# Patient Record
Sex: Female | Born: 1975 | Race: Black or African American | Hispanic: No | Marital: Married | Smoking: Never smoker
Health system: Southern US, Community
[De-identification: ages and names within clinical notes are randomized; demographics above are authoritative.]

## PROBLEM LIST (undated history)

## (undated) ENCOUNTER — Inpatient Hospital Stay: Payer: Self-pay

## (undated) DIAGNOSIS — K8020 Calculus of gallbladder without cholecystitis without obstruction: Secondary | ICD-10-CM

## (undated) DIAGNOSIS — D649 Anemia, unspecified: Secondary | ICD-10-CM

## (undated) DIAGNOSIS — O24419 Gestational diabetes mellitus in pregnancy, unspecified control: Secondary | ICD-10-CM

## (undated) DIAGNOSIS — O139 Gestational [pregnancy-induced] hypertension without significant proteinuria, unspecified trimester: Secondary | ICD-10-CM

## (undated) HISTORY — PX: TUBAL LIGATION: SHX77

## (undated) HISTORY — DX: Gestational diabetes mellitus in pregnancy, unspecified control: O24.419

---

## 2000-01-19 DIAGNOSIS — O139 Gestational [pregnancy-induced] hypertension without significant proteinuria, unspecified trimester: Secondary | ICD-10-CM

## 2000-01-19 HISTORY — DX: Gestational (pregnancy-induced) hypertension without significant proteinuria, unspecified trimester: O13.9

## 2014-08-16 LAB — OB RESULTS CONSOLE GC/CHLAMYDIA
Chlamydia: NEGATIVE
GC PROBE AMP, GENITAL: NEGATIVE

## 2014-08-16 LAB — OB RESULTS CONSOLE RPR: RPR: NONREACTIVE

## 2014-08-16 LAB — OB RESULTS CONSOLE HEPATITIS B SURFACE ANTIGEN: Hepatitis B Surface Ag: NEGATIVE

## 2014-08-16 LAB — OB RESULTS CONSOLE HIV ANTIBODY (ROUTINE TESTING): HIV: NONREACTIVE

## 2014-12-16 ENCOUNTER — Other Ambulatory Visit: Payer: Self-pay | Admitting: Obstetrics and Gynecology

## 2014-12-16 DIAGNOSIS — Z369 Encounter for antenatal screening, unspecified: Secondary | ICD-10-CM

## 2014-12-22 ENCOUNTER — Encounter: Payer: Self-pay | Admitting: Emergency Medicine

## 2014-12-22 ENCOUNTER — Emergency Department
Admission: EM | Admit: 2014-12-22 | Discharge: 2014-12-22 | Disposition: A | Discharge status: Home / Self Care | Payer: Managed Care, Other (non HMO) | Attending: Emergency Medicine | Admitting: Emergency Medicine

## 2014-12-22 DIAGNOSIS — O10011 Pre-existing essential hypertension complicating pregnancy, first trimester: Secondary | ICD-10-CM | POA: Diagnosis not present

## 2014-12-22 DIAGNOSIS — Z3A10 10 weeks gestation of pregnancy: Secondary | ICD-10-CM | POA: Insufficient documentation

## 2014-12-22 DIAGNOSIS — O161 Unspecified maternal hypertension, first trimester: Secondary | ICD-10-CM

## 2014-12-22 DIAGNOSIS — O210 Mild hyperemesis gravidarum: Secondary | ICD-10-CM | POA: Diagnosis present

## 2014-12-22 LAB — CBC
HCT: 39.5 % (ref 35.0–47.0)
Hemoglobin: 13.1 g/dL (ref 12.0–16.0)
MCH: 29.5 pg (ref 26.0–34.0)
MCHC: 33.1 g/dL (ref 32.0–36.0)
MCV: 89 fL (ref 80.0–100.0)
PLATELETS: 379 10*3/uL (ref 150–440)
RBC: 4.44 MIL/uL (ref 3.80–5.20)
RDW: 13.9 % (ref 11.5–14.5)
WBC: 11.9 10*3/uL — ABNORMAL HIGH (ref 3.6–11.0)

## 2014-12-22 LAB — LIPASE, BLOOD: Lipase: 52 U/L — ABNORMAL HIGH (ref 11–51)

## 2014-12-22 LAB — URINALYSIS COMPLETE WITH MICROSCOPIC (ARMC ONLY)
Bilirubin Urine: NEGATIVE
GLUCOSE, UA: NEGATIVE mg/dL
Hgb urine dipstick: NEGATIVE
Leukocytes, UA: NEGATIVE
Nitrite: NEGATIVE
PROTEIN: 100 mg/dL — AB
Specific Gravity, Urine: 1.026 (ref 1.005–1.030)
pH: 6 (ref 5.0–8.0)

## 2014-12-22 LAB — COMPREHENSIVE METABOLIC PANEL
ALT: 19 U/L (ref 14–54)
AST: 22 U/L (ref 15–41)
Albumin: 3.9 g/dL (ref 3.5–5.0)
Alkaline Phosphatase: 55 U/L (ref 38–126)
Anion gap: 11 (ref 5–15)
BILIRUBIN TOTAL: 0.8 mg/dL (ref 0.3–1.2)
BUN: 7 mg/dL (ref 6–20)
CHLORIDE: 101 mmol/L (ref 101–111)
CO2: 26 mmol/L (ref 22–32)
CREATININE: 0.81 mg/dL (ref 0.44–1.00)
Calcium: 10.1 mg/dL (ref 8.9–10.3)
GFR calc Af Amer: >60 mL/min (ref >60)
Glucose, Bld: 111 mg/dL — ABNORMAL HIGH (ref 65–99)
Potassium: 3.3 mmol/L — ABNORMAL LOW (ref 3.5–5.1)
Sodium: 138 mmol/L (ref 135–145)
Total Protein: 8.8 g/dL — ABNORMAL HIGH (ref 6.5–8.1)

## 2014-12-22 MED ORDER — ONDANSETRON HCL 4 MG/2ML IJ SOLN
INTRAMUSCULAR | Status: DC
Start: 2014-12-22 — End: 2014-12-23
  Filled 2014-12-22: qty 2

## 2014-12-22 MED ORDER — SODIUM CHLORIDE 0.9 % IV BOLUS (SEPSIS)
1000.0000 mL | Freq: Once | INTRAVENOUS | Status: AC
Start: 2014-12-22 — End: 2014-12-22
  Administered 2014-12-22: 1000 mL via INTRAVENOUS

## 2014-12-22 MED ORDER — ONDANSETRON HCL 4 MG/2ML IJ SOLN
4.0000 mg | Freq: Once | INTRAMUSCULAR | Status: AC
  Administered 2014-12-22: 4 mg via INTRAVENOUS

## 2014-12-22 NOTE — ED Notes (Signed)
States vomiting x 4 days.  Patient is [redacted] weeks pregnant.  Given a  RX for Phenergan 25 mg 1 tablet every 6 hours, last taken at 1000.  Patient states it is not helping. Denies urinary complaint.

## 2014-12-22 NOTE — Discharge Instructions (Signed)
Hypertension During Pregnancy Hypertension, or high blood pressure, is when there is extra pressure inside your blood vessels that carry blood from the heart to the rest of your body (arteries). It can happen at any time in life, including pregnancy. Hypertension during pregnancy can cause problems for you and your baby. Your baby might not weigh as much as he or she should at birth or might be born early (premature). Very bad cases of hypertension during pregnancy can be life-threatening.  Different types of hypertension can occur during pregnancy. These include:  Chronic hypertension. This happens when a woman has hypertension before pregnancy and it continues during pregnancy.  Gestational hypertension. This is when hypertension develops during pregnancy.  Preeclampsia or toxemia of pregnancy. This is a very serious type of hypertension that develops only during pregnancy. It affects the whole body and can be very dangerous for both mother and baby.  Gestational hypertension and preeclampsia usually go away after your baby is born. Your blood pressure will likely stabilize within 6 weeks. Women who have hypertension during pregnancy have a greater chance of developing hypertension later in life or with future pregnancies. RISK FACTORS There are certain factors that make it more likely for you to develop hypertension during pregnancy. These include:  Having hypertension before pregnancy.  Having hypertension during a previous pregnancy.  Being overweight.  Being older than 40 years.  Being pregnant with more than one baby.  Having diabetes or kidney problems. SIGNS AND SYMPTOMS Chronic and gestational hypertension rarely cause symptoms. Preeclampsia has symptoms, which may include:  Increased protein in your urine. Your health care provider will check for this at every prenatal visit.  Swelling of your hands and face.  Rapid weight gain.  Headaches.  Visual changes.  Being  bothered by light.  Abdominal pain, especially in the upper right area.  Chest pain.  Shortness of breath.  Increased reflexes.  Seizures. These occur with a more severe form of preeclampsia, called eclampsia. DIAGNOSIS  You may be diagnosed with hypertension during a regular prenatal exam. At each prenatal visit, you may have:  Your blood pressure checked.  A urine test to check for protein in your urine. The type of hypertension you are diagnosed with depends on when you developed it. It also depends on your specific blood pressure reading.  Developing hypertension before 20 weeks of pregnancy is consistent with chronic hypertension.  Developing hypertension after 20 weeks of pregnancy is consistent with gestational hypertension.  Hypertension with increased urinary protein is diagnosed as preeclampsia.  Blood pressure measurements that stay above 834 systolic or 196 diastolic are a sign of severe preeclampsia. TREATMENT Treatment for hypertension during pregnancy varies. Treatment depends on the type of hypertension and how serious it is.  If you take medicine for chronic hypertension, you may need to switch medicines.  Medicines called ACE inhibitors should not be taken during pregnancy.  Low-dose aspirin may be suggested for women who have risk factors for preeclampsia.  If you have gestational hypertension, you may need to take a blood pressure medicine that is safe during pregnancy. Your health care provider will recommend the correct medicine.  If you have severe preeclampsia, you may need to be in the hospital. Health care providers will watch you and your baby very closely. You also may need to take medicine called magnesium sulfate to prevent seizures and lower blood pressure.  Sometimes, an early delivery is needed. This may be the case if the condition worsens. It would be  done to protect you and your baby. The only cure for preeclampsia is delivery.  Your health  care provider may recommend that you take one low-dose aspirin (81 mg) each day to help prevent high blood pressure during your pregnancy if you are at risk for preeclampsia. You may be at risk for preeclampsia if:  You had preeclampsia or eclampsia during a previous pregnancy.  Your baby did not grow as expected during a previous pregnancy.  You experienced preterm birth with a previous pregnancy.  You experienced a separation of the placenta from the uterus (placental abruption) during a previous pregnancy.  You experienced the loss of your baby during a previous pregnancy.  You are pregnant with more than one baby.  You have other medical conditions, such as diabetes or an autoimmune disease. HOME CARE INSTRUCTIONS  Schedule and keep all of your regular prenatal care appointments. This is important.  Take medicines only as directed by your health care provider. Tell your health care provider about all medicines you take.  Eat as little salt as possible.  Get regular exercise.  Do not drink alcohol.  Do not use tobacco products.  Do not drink products with caffeine.  Lie on your left side when resting. SEEK IMMEDIATE MEDICAL CARE IF:  You have severe abdominal pain.  You have sudden swelling in your hands, ankles, or face.  You gain 4 pounds (1.8 kg) or more in 1 week.  You vomit repeatedly.  You have vaginal bleeding.  You do not feel your baby moving as much.  You have a headache.  You have blurred or double vision.  You have muscle twitching or spasms.  You have shortness of breath.  You have blue fingernails or lips.  You have blood in your urine. MAKE SURE YOU:  Understand these instructions.  Will watch your condition.  Will get help right away if you are not doing well or get worse.   This information is not intended to replace advice given to you by your health care provider. Make sure you discuss any questions you have with your health care  provider.   Document Released: 09/22/2010 Document Revised: 01/25/2014 Document Reviewed: 08/03/2012 Elsevier Interactive Patient Education 2016 [employer]  Hyperemesis Gravidarum Hyperemesis gravidarum is a severe form of nausea and vomiting that happens during pregnancy. Hyperemesis is worse than morning sickness. It may cause you to have nausea or vomiting all day for many days. It may keep you from eating and drinking enough food and liquids. Hyperemesis usually occurs during the first half (the first 20 weeks) of pregnancy. It often goes away once a woman is in her second half of pregnancy. However, sometimes hyperemesis continues through an entire pregnancy.  CAUSES  The cause of this condition is not completely known but is thought to be related to changes in the body's hormones when pregnant. It could be from the high level of the pregnancy hormone or an increase in estrogen in the body.  SIGNS AND SYMPTOMS   Severe nausea and vomiting.  Nausea that does not go away.  Vomiting that does not allow you to keep any food down.  Weight loss and body fluid loss (dehydration).  Having no desire to eat or not liking food you have previously enjoyed. DIAGNOSIS  Your health care provider will do a physical exam and ask you about your symptoms. He or she may also order blood tests and urine tests to make sure something else is not causing the problem.  TREATMENT  You may only need medicine to control the problem. If medicines do not control the nausea and vomiting, you will be treated in the hospital to prevent dehydration, increased acid in the blood (acidosis), weight loss, and changes in the electrolytes in your body that may harm the unborn baby (fetus). You may need IV fluids.  HOME CARE INSTRUCTIONS   Only take over-the-counter or prescription medicines as directed by your health care provider.  Try eating a couple of dry crackers or toast in the morning before getting out of  bed.  Avoid foods and smells that upset your stomach.  Avoid fatty and spicy foods.  Eat 5-6 small meals a day.  Do not drink when eating meals. Drink between meals.  For snacks, eat high-protein foods, such as cheese.  Eat or suck on things that have ginger in them. Ginger helps nausea.  Avoid food preparation. The smell of food can spoil your appetite.  Avoid iron pills and iron in your multivitamins until after 3-4 months of being pregnant. However, consult with your health care provider before stopping any prescribed iron pills. SEEK MEDICAL CARE IF:   Your abdominal pain increases.  You have a severe headache.  You have vision problems.  You are losing weight. SEEK IMMEDIATE MEDICAL CARE IF:   You are unable to keep fluids down.  You vomit blood.  You have constant nausea and vomiting.  You have excessive weakness.  You have extreme thirst.  You have dizziness or fainting.  You have a fever or persistent symptoms for more than 2-3 days.  You have a fever and your symptoms suddenly get worse. MAKE SURE YOU:   Understand these instructions.  Will watch your condition.  Will get help right away if you are not doing well or get worse.   This information is not intended to replace advice given to you by your health care provider. Make sure you discuss any questions you have with your health care provider.   Document Released: 01/04/2005 Document Revised: 10/25/2012 Document Reviewed: 08/16/2012 Elsevier Interactive Patient Education [employer].

## 2014-12-22 NOTE — ED Notes (Signed)
Patient with no complaints at this time. Respirations even and unlabored. Skin warm/dry. Discharge instructions reviewed with patient at this time. Patient given opportunity to voice concerns/ask questions. IV removed per policy and band-aid applied to site. Patient discharged at this time and left Emergency Department with steady gait.  

## 2014-12-22 NOTE — ED Provider Notes (Signed)
[facility] Emergency Department Provider Note  ____________________________________________  Time seen: Approximately 8 PM  I have reviewed the triage vital signs and the nursing notes.   HISTORY  Chief Complaint Emesis During Pregnancy    HPI Taylor Shannon is a 39 y.o. female with a history of C-section and preeclampsia is presenting today with vomiting. She says she has been vomiting over the past 4 days. Despite taking B6 as well as Phenergan at home she has still been vomiting. She denies any abdominal pain. Says the vomit has been yellow. Denies any diarrhea. Says that she also had hyperemesis gravidarum with her past pregnancies need to be admitted once. She got a dose of Zofran as well as fluids in triage and now does not feel nauseous at all and is tolerating by mouth fluids. Denies any vaginal bleeding or discharge.   History reviewed. No pertinent past medical history.  There are no active problems to display for this patient.   Past Surgical History  Procedure Laterality Date  . Cesarean section      No current outpatient prescriptions on file.  Allergies Review of patient's allergies indicates no known allergies.  No family history on file.  Social History Social History  Substance Use Topics  . Smoking status: Never Smoker   . Smokeless tobacco: Never Used  . Alcohol Use: No    Review of Systems Constitutional: No fever/chills Eyes: No visual changes. ENT: No sore throat. Cardiovascular: Denies chest pain. Respiratory: Denies shortness of breath. Gastrointestinal: No abdominal pain.   No diarrhea.  No constipation. Genitourinary: Negative for dysuria. Musculoskeletal: Negative for back pain. Skin: Negative for rash. Neurological: Negative for headaches, focal weakness or numbness.  10-point ROS otherwise negative.  ____________________________________________   PHYSICAL EXAM:  VITAL SIGNS: ED Triage Vitals  Enc  Vitals Group     BP 12/22/14 1819 148/96 mmHg     Pulse Rate 12/22/14 1815 106     Resp 12/22/14 1815 16     Temp 12/22/14 1815 98.8 F (37.1 C)     Temp Source 12/22/14 1815 Oral     SpO2 12/22/14 1815 97 %     Weight 12/22/14 1815 190 lb (86.183 kg)     Height 12/22/14 1815 5\' 5"  (1.651 m)     Head Cir --      Peak Flow --      Pain Score 12/22/14 1817 0     Pain Loc --      Pain Edu? --      Excl. in [place]? --     Constitutional: Alert and oriented. Well appearing and in no acute distress. Eyes: Conjunctivae are normal. PERRL. EOMI. Head: Atraumatic. Nose: No congestion/rhinnorhea. Mouth/Throat: Mucous membranes are moist.   Neck: No stridor.   Cardiovascular: Normal rate, regular rhythm. Grossly normal heart sounds.  Good peripheral circulation. Respiratory: Normal respiratory effort.  No retractions. Lungs CTAB. Gastrointestinal: Soft and nontender. No distention. No abdominal bruits. No CVA tenderness. Musculoskeletal: No lower extremity tenderness nor edema.  No joint effusions. Neurologic:  Normal speech and language. No gross focal neurologic deficits are appreciated. No gait instability. Skin:  Skin is warm, dry and intact. No rash noted. Psychiatric: Mood and affect are normal. Speech and behavior are normal.  ____________________________________________   LABS (all labs ordered are listed, but only abnormal results are displayed)  Labs Reviewed  LIPASE, BLOOD - Abnormal; Notable for the following:    Lipase 52 (*)    All other  components within normal limits  COMPREHENSIVE METABOLIC PANEL - Abnormal; Notable for the following:    Potassium 3.3 (*)    Glucose, Bld 111 (*)    Total Protein 8.8 (*)    All other components within normal limits  CBC - Abnormal; Notable for the following:    WBC 11.9 (*)    All other components within normal limits  URINALYSIS COMPLETEWITH MICROSCOPIC ([facility] ONLY) - Abnormal; Notable for the following:    Color, Urine YELLOW (*)     APPearance HAZY (*)    Ketones, ur TRACE (*)    Protein, ur 100 (*)    Bacteria, UA RARE (*)    Squamous Epithelial / LPF 0-5 (*)    All other components within normal limits   ____________________________________________  EKG   ____________________________________________  RADIOLOGY   ____________________________________________   PROCEDURES  ____________________________________________   INITIAL IMPRESSION / ASSESSMENT AND PLAN / ED COURSE  Pertinent labs & imaging results that were available during my care of the patient were reviewed by me and considered in my medical decision making (see chart for details).  ----------------------------------------- 8:54 PM on 12/22/2014 -----------------------------------------  Patient is resting comfortable now and continues to be without nausea or vomiting. I did discuss the case Dr. Leafy Ro because of elevated blood pressures. However, the last blood pressure was in the 120s over 70s. She did have protein in her urine. I did discuss with the patient return precautions for frequency such as headache, vision changes or right upper quadrant abdominal pain and she understands and is willing to comply with this. A Beasley did recommend her coming into the office for a recheck of blood pressure within the week but does not want surgery medications at this time. ____________________________________________   FINAL CLINICAL IMPRESSION(S) / ED DIAGNOSES  Hyperemesis gravidarum. Elevated blood pressure pregnancy.      Orbie Pyo, MD 12/22/14 2056

## 2014-12-27 ENCOUNTER — Emergency Department
Admission: EM | Admit: 2014-12-27 | Discharge: 2014-12-27 | Disposition: A | Discharge status: Home / Self Care | Payer: Managed Care, Other (non HMO) | Attending: Emergency Medicine | Admitting: Emergency Medicine

## 2014-12-27 ENCOUNTER — Encounter: Payer: Self-pay | Admitting: Emergency Medicine

## 2014-12-27 DIAGNOSIS — Z3A22 22 weeks gestation of pregnancy: Secondary | ICD-10-CM | POA: Diagnosis not present

## 2014-12-27 DIAGNOSIS — O212 Late vomiting of pregnancy: Secondary | ICD-10-CM | POA: Insufficient documentation

## 2014-12-27 DIAGNOSIS — O219 Vomiting of pregnancy, unspecified: Secondary | ICD-10-CM

## 2014-12-27 LAB — URINALYSIS COMPLETE WITH MICROSCOPIC (ARMC ONLY)
BILIRUBIN URINE: NEGATIVE
GLUCOSE, UA: NEGATIVE mg/dL
HGB URINE DIPSTICK: NEGATIVE
LEUKOCYTES UA: NEGATIVE
NITRITE: NEGATIVE
PH: 5 (ref 5.0–8.0)
Protein, ur: 30 mg/dL — AB
SPECIFIC GRAVITY, URINE: 1.024 (ref 1.005–1.030)

## 2014-12-27 LAB — COMPREHENSIVE METABOLIC PANEL
ALBUMIN: 4 g/dL (ref 3.5–5.0)
ALT: 25 U/L (ref 14–54)
AST: 24 U/L (ref 15–41)
Alkaline Phosphatase: 49 U/L (ref 38–126)
Anion gap: 10 (ref 5–15)
BILIRUBIN TOTAL: 1.2 mg/dL (ref 0.3–1.2)
BUN: 7 mg/dL (ref 6–20)
CO2: 26 mmol/L (ref 22–32)
CREATININE: 0.68 mg/dL (ref 0.44–1.00)
Calcium: 9.7 mg/dL (ref 8.9–10.3)
Chloride: 101 mmol/L (ref 101–111)
GFR calc Af Amer: >60 mL/min (ref >60)
GLUCOSE: 81 mg/dL (ref 65–99)
POTASSIUM: 3.4 mmol/L — AB (ref 3.5–5.1)
Sodium: 137 mmol/L (ref 135–145)
TOTAL PROTEIN: 8.5 g/dL — AB (ref 6.5–8.1)

## 2014-12-27 LAB — LIPASE, BLOOD: Lipase: 30 U/L (ref 11–51)

## 2014-12-27 LAB — CBC
HEMATOCRIT: 38.1 % (ref 35.0–47.0)
Hemoglobin: 12.7 g/dL (ref 12.0–16.0)
MCH: 29.5 pg (ref 26.0–34.0)
MCHC: 33.3 g/dL (ref 32.0–36.0)
MCV: 88.5 fL (ref 80.0–100.0)
PLATELETS: 331 10*3/uL (ref 150–440)
RBC: 4.3 MIL/uL (ref 3.80–5.20)
RDW: 13.6 % (ref 11.5–14.5)
WBC: 7 10*3/uL (ref 3.6–11.0)

## 2014-12-27 LAB — KETONES, URINE

## 2014-12-27 MED ORDER — SODIUM CHLORIDE 0.9 % IV BOLUS (SEPSIS)
1000.0000 mL | Freq: Once | INTRAVENOUS | Status: AC
  Administered 2014-12-27: 1000 mL via INTRAVENOUS

## 2014-12-27 MED ORDER — POTASSIUM CHLORIDE CRYS ER 20 MEQ PO TBCR
40.0000 meq | EXTENDED_RELEASE_TABLET | Freq: Once | ORAL | Status: AC
  Administered 2014-12-27: 40 meq via ORAL
  Filled 2014-12-27: qty 2

## 2014-12-27 MED ORDER — ONDANSETRON HCL 4 MG PO TABS: 4.0000 mg | ORAL_TABLET | Freq: Three times a day (TID) | ORAL | Status: DC | PRN

## 2014-12-27 MED ORDER — ONDANSETRON 4 MG PO TBDP
4.0000 mg | ORAL_TABLET | Freq: Once | ORAL | Status: AC
  Administered 2014-12-27: 4 mg via ORAL

## 2014-12-27 MED ORDER — ONDANSETRON HCL 4 MG/2ML IJ SOLN
INTRAMUSCULAR | Status: AC
  Filled 2014-12-27: qty 2

## 2014-12-27 MED ORDER — ONDANSETRON HCL 4 MG/2ML IJ SOLN: 4.0000 mg | Freq: Once | INTRAMUSCULAR | Status: DC

## 2014-12-27 MED ORDER — ONDANSETRON 4 MG PO TBDP
ORAL_TABLET | ORAL | Status: AC
  Administered 2014-12-27: 4 mg via ORAL
  Filled 2014-12-27: qty 1

## 2014-12-27 MED ORDER — METOCLOPRAMIDE HCL 10 MG PO TABS: 10.0000 mg | ORAL_TABLET | Freq: Three times a day (TID) | ORAL | Status: DC

## 2014-12-27 MED ORDER — PROMETHAZINE HCL 25 MG/ML IJ SOLN
25.0000 mg | Freq: Once | INTRAMUSCULAR | Status: AC
  Administered 2014-12-27: 25 mg via INTRAVENOUS
  Filled 2014-12-27: qty 1

## 2014-12-27 NOTE — ED Provider Notes (Signed)
[facility] Emergency Department Provider Note   ____________________________________________  Time seen:  I have reviewed the triage vital signs and the triage nursing note.  HISTORY  Chief Complaint Emesis During Pregnancy   Historian Patient  HPI Taylor Shannon is a 39 y.o. female who is approximately [redacted] weeks pregnant and has started with nausea and vomiting already with this pregnancy. She was seen on Sunday and treated for hyperemesis gravidarum with IV fluids as well as Zofran IV. At that point time she had been taking promethazine, and stated it wasn't working that well. Patient states that she is out of the promethazine prescription. She has at times over this pregnancy tried vitamin B6 and ginger. She follows with [facility] clinic OB.  She's had no chest pain or abdominal pain or trouble breathing. She's had no fevers. She's had no vomiting. Symptoms are moderate. Today she had numerous episodes of emesis and was unable to keep liquids down this morning.    History reviewed. No pertinent past medical history. prior nausea and vomiting throughout entire pregnancy. History of preeclampsia with prior pregnancy.  There are no active problems to display for this patient.   Past Surgical History  Procedure Laterality Date  . Cesarean section      Current Outpatient Rx  Name  Route  Sig  Dispense  Refill  . metoCLOPramide (REGLAN) 10 MG tablet   Oral   Take 1 tablet (10 mg total) by mouth 3 (three) times daily with meals.   63 tablet   0   . ondansetron (ZOFRAN) 4 MG tablet   Oral   Take 1 tablet (4 mg total) by mouth every 8 (eight) hours as needed for nausea or vomiting.   21 tablet   0     Allergies Review of patient's allergies indicates no known allergies.  No family history on file.  Social History Social History  Substance Use Topics  . Smoking status: Never Smoker   . Smokeless tobacco: Never Used  . Alcohol Use: No     Review of Systems  Constitutional: Negative for fever. Eyes: Negative for visual changes. ENT: Negative for sore throat. Cardiovascular: Negative for chest pain. Respiratory: Negative for shortness of breath. Gastrointestinal: Negative for abdominal pain or diarrhea. Genitourinary: Negative for dysuria. Musculoskeletal: Negative for back pain. Skin: Negative for rash. Neurological: Negative for headache. 10 point Review of Systems otherwise negative ____________________________________________   PHYSICAL EXAM:  VITAL SIGNS: ED Triage Vitals  Enc Vitals Group     BP 12/27/14 1138 128/93 mmHg     Pulse Rate 12/27/14 1138 104     Resp 12/27/14 1138 20     Temp 12/27/14 1138 98 F (36.7 C)     Temp Source 12/27/14 1138 Oral     SpO2 12/27/14 1138 97 %     Weight 12/27/14 1138 190 lb (86.183 kg)     Height 12/27/14 1138 5\' 5"  (1.651 m)     Head Cir --      Peak Flow --      Pain Score --      Pain Loc --      Pain Edu? --      Excl. in [place]? --      Constitutional: Alert and oriented. Well appearing and in no distress. Eyes: Conjunctivae are normal. PERRL. Normal extraocular movements. ENT   Head: Normocephalic and atraumatic.   Nose: No congestion/rhinnorhea.   Mouth/Throat: Mucous membranes are moist.   Neck: No stridor. Cardiovascular/Chest:  Normal rate, regular rhythm.  No murmurs, rubs, or gallops. Respiratory: Normal respiratory effort without tachypnea nor retractions. Breath sounds are clear and equal bilaterally. No wheezes/rales/rhonchi. Gastrointestinal: Soft. No distention, no guarding, no rebound. Nontender   Genitourinary/rectal:Deferred Musculoskeletal: Nontender with normal range of motion in all extremities. No joint effusions.  No lower extremity tenderness.  No edema. Neurologic:  Normal speech and language. No gross or focal neurologic deficits are appreciated. Skin:  Skin is warm, dry and intact. No rash noted. Psychiatric: Mood and  affect are normal. Speech and behavior are normal. Patient exhibits appropriate insight and judgment.  ____________________________________________   EKG I, Lisa Roca, MD, the attending physician have personally viewed and interpreted all ECGs.  None ____________________________________________  LABS (pertinent positives/negatives)  Lipase is normal [place] a metabolic panel significant for potassium 3.4 and otherwise without significant abnormalities CBC within normal limits Urinalysis rare bacteria and 2+ ketones, 30 protein otherwise negative Repeat urine ketones 1+ ____________________________________________  RADIOLOGY All Xrays were viewed by me. Imaging interpreted by Radiologist.  None __________________________________________  PROCEDURES  Procedure(s) performed: None  Critical Care performed: None  ____________________________________________   ED COURSE / ASSESSMENT AND PLAN  CONSULTATIONS: Face to face consultation with Dr. Higinio Roger OB - came to the bedside and recommends the patient get 1 dose of Zofran here. Recommends discharge with Reglan 10 mg 3 times daily with meals, and Zofran as needed for nausea. Follow-up as scheduled December 21st.  Pertinent labs & imaging results that were available during my care of the patient were reviewed by me and considered in my medical decision making (see chart for details).   Patient is overall well-appearing with stable vital signs, but with report of significant vomiting and nausea this morning. I discussed possible antiemetic treatments with the patient and we chose to proceed with IV fluids and promethazine. Patient was able take fluids after this. She was given a potassium supplement for mild hypokalemia.  There is some question about her previous history with preeclampsia and elevated blood pressures, her blood pressure is stable here.   I discussed the case with on-call [facility] clinic OB, who  recommended repeat urine ketones. Dr. Newman Nip did see patient here in the emergency department and recommends discharge home with Reglan and Zofran.  Patient / Family / Caregiver informed of clinical course, medical decision-making process, and agree with plan.   I discussed return precautions, follow-up instructions, and discharged instructions with patient and/or family.  ___________________________________________   FINAL CLINICAL IMPRESSION(S) / ED DIAGNOSES   Final diagnoses:  Nausea and vomiting during pregnancy prior to [redacted] weeks gestation       Lisa Roca, MD 12/27/14 1840

## 2014-12-27 NOTE — ED Notes (Addendum)
States she is approx 11 weeks preg and vomiting  States she was seen on Sunday for same. Denies any pain   weakness

## 2014-12-27 NOTE — ED Notes (Signed)
Pt unable to give urine sample at this time 

## 2014-12-27 NOTE — Discharge Instructions (Signed)
You were evaluated for nausea and vomiting in early pregnancy.  Return to the emergency department for any worsening condition including vomiting and unable to keep fluids down, including any concern for dehydration such as decreased urine output, dry mouth, dizziness or lightheadedness/passing out.  Continue over-the-counter supplement with vitamin B6 as well as ginger for nausea in pregnancy.  Eat small frequent meals and liquids throughout the day.  As recommended by Dr. Newman Nip, you are being prescribed Reglan three times daily with meals, and Zofran as needed for nausea.    Hyperemesis Gravidarum Hyperemesis gravidarum is a severe form of nausea and vomiting that happens during pregnancy. Hyperemesis is worse than morning sickness. It may cause you to have nausea or vomiting all day for many days. It may keep you from eating and drinking enough food and liquids. Hyperemesis usually occurs during the first half (the first 20 weeks) of pregnancy. It often goes away once a woman is in her second half of pregnancy. However, sometimes hyperemesis continues through an entire pregnancy.  CAUSES  The cause of this condition is not completely known but is thought to be related to changes in the body's hormones when pregnant. It could be from the high level of the pregnancy hormone or an increase in estrogen in the body.  SIGNS AND SYMPTOMS   Severe nausea and vomiting.  Nausea that does not go away.  Vomiting that does not allow you to keep any food down.  Weight loss and body fluid loss (dehydration).  Having no desire to eat or not liking food you have previously enjoyed. DIAGNOSIS  Your health care provider will do a physical exam and ask you about your symptoms. He or she may also order blood tests and urine tests to make sure something else is not causing the problem.  TREATMENT  You may only need medicine to control the problem. If medicines do not control the nausea and vomiting, you will  be treated in the hospital to prevent dehydration, increased acid in the blood (acidosis), weight loss, and changes in the electrolytes in your body that may harm the unborn baby (fetus). You may need IV fluids.  HOME CARE INSTRUCTIONS   Only take over-the-counter or prescription medicines as directed by your health care provider.  Try eating a couple of dry crackers or toast in the morning before getting out of bed.  Avoid foods and smells that upset your stomach.  Avoid fatty and spicy foods.  Eat 5-6 small meals a day.  Do not drink when eating meals. Drink between meals.  For snacks, eat high-protein foods, such as cheese.  Eat or suck on things that have ginger in them. Ginger helps nausea.  Avoid food preparation. The smell of food can spoil your appetite.  Avoid iron pills and iron in your multivitamins until after 3-4 months of being pregnant. However, consult with your health care provider before stopping any prescribed iron pills. SEEK MEDICAL CARE IF:   Your abdominal pain increases.  You have a severe headache.  You have vision problems.  You are losing weight. SEEK IMMEDIATE MEDICAL CARE IF:   You are unable to keep fluids down.  You vomit blood.  You have constant nausea and vomiting.  You have excessive weakness.  You have extreme thirst.  You have dizziness or fainting.  You have a fever or persistent symptoms for more than 2-3 days.  You have a fever and your symptoms suddenly get worse. MAKE SURE YOU:  Understand these instructions.  Will watch your condition.  Will get help right away if you are not doing well or get worse.   This information is not intended to replace advice given to you by your health care provider. Make sure you discuss any questions you have with your health care provider.   Document Released: 01/04/2005 Document Revised: 10/25/2012 Document Reviewed: 08/16/2012 Elsevier Interactive Patient Education [employer].

## 2015-01-09 ENCOUNTER — Ambulatory Visit
Admission: RE | Admit: 2015-01-09 | Discharge: 2015-01-09 | Disposition: A | Discharge status: Home / Self Care | Payer: Managed Care, Other (non HMO) | Source: Ambulatory Visit | Attending: Obstetrics and Gynecology | Admitting: Obstetrics and Gynecology

## 2015-01-09 ENCOUNTER — Ambulatory Visit (HOSPITAL_BASED_OUTPATIENT_CLINIC_OR_DEPARTMENT_OTHER)
Admission: RE | Admit: 2015-01-09 | Discharge: 2015-01-09 | Disposition: A | Discharge status: Home / Self Care | Payer: Managed Care, Other (non HMO) | Source: Ambulatory Visit | Attending: Obstetrics & Gynecology | Admitting: Obstetrics & Gynecology

## 2015-01-09 VITALS — BP 134/88 | HR 103 | Temp 98.0°F | Resp 18 | Ht 64.8 in | Wt 198.8 lb

## 2015-01-09 DIAGNOSIS — O09529 Supervision of elderly multigravida, unspecified trimester: Secondary | ICD-10-CM | POA: Insufficient documentation

## 2015-01-09 DIAGNOSIS — O09521 Supervision of elderly multigravida, first trimester: Secondary | ICD-10-CM

## 2015-01-09 DIAGNOSIS — Z369 Encounter for antenatal screening, unspecified: Secondary | ICD-10-CM

## 2015-01-09 HISTORY — DX: Gestational (pregnancy-induced) hypertension without significant proteinuria, unspecified trimester: O13.9

## 2015-01-09 NOTE — Progress Notes (Addendum)
Referring Provider:   [facility] OB/[facility] Length of Consultation: 30 minutes  Taylor Shannon was referred to [facility] for genetic counseling because of advanced maternal age.  The patient will be 39 years old at the time of delivery.  This note summarizes the information we discussed.    We explained that the chance of a chromosome abnormality increases with maternal age.  Chromosomes and examples of chromosome problems were reviewed.  Humans typically have 46 chromosomes in each cell, with half passed through each sperm and egg.  Any change in the number or structure of chromosomes can increase the risk of problems in the physical and mental development of a pregnancy.   Based upon age of the patient, the chance of any chromosome abnormality was 1 in 58. The chance of Down syndrome, the most common chromosome problem associated with maternal age, was 1 in 8.  The risk of chromosome problems is in addition to the 3% general population risk for birth defects and mental retardation.  The greatest chance, of course, is that the baby would be born in good health.  We discussed the following prenatal screening and testing options for this pregnancy:  First trimester screening, which includes nuchal translucency ultrasound screen and first trimester maternal serum marker screening.  The nuchal translucency has approximately an 80% detection rate for Down syndrome and can be positive for other chromosome abnormalities as well as heart defects.  When combined with a maternal serum marker screening, the detection rate is up to 90% for Down syndrome and up to 97% for trisomy 18.     The chorionic villus sampling procedure is available for first trimester chromosome analysis.  This involves the withdrawal of a small amount of chorionic villi (tissue from the developing placenta).  Risk of pregnancy loss is estimated to be approximately 1 in 200 to 1 in 100 (0.5 to 1%).  There is approximately a  1% (1 in 100) chance that the CVS chromosome results will be unclear.  Chorionic villi cannot be tested for neural tube defects.     Maternal serum marker screening, a blood test that measures pregnancy proteins, can provide risk assessments for Down syndrome, trisomy 18, and open neural tube defects (spina bifida, anencephaly). Because it does not directly examine the fetus, it cannot positively diagnose or rule out these problems.  Targeted ultrasound uses high frequency sound waves to create an image of the developing fetus.  An ultrasound is often recommended as a routine means of evaluating the pregnancy.  It is also used to screen for fetal anatomy problems (for example, a heart defect) that might be suggestive of a chromosomal or other abnormality.   Amniocentesis involves the removal of a small amount of amniotic fluid from the sac surrounding the fetus with the use of a thin needle inserted through the maternal abdomen and uterus.  Ultrasound guidance is used throughout the procedure.  Fetal cells from amniotic fluid are directly evaluated and > 99.5% of chromosome problems and > 98% of open neural tube defects can be detected. This procedure is generally performed after the 15th week of pregnancy.  The main risks to this procedure include complications leading to miscarriage in less than 1 in 200 cases (0.5%).  We also reviewed the availability of cell free fetal DNA testing from maternal blood to determine whether or not the baby may have either Down syndrome, trisomy 69, or trisomy 71.  This test utilizes a maternal blood sample and DNA  sequencing technology to isolate circulating cell free fetal DNA from maternal plasma.  The fetal DNA can then be analyzed for DNA sequences that are derived from the three most common chromosomes involved in aneuploidy, chromosomes 13, 18, and 21.  If the overall amount of DNA is greater than the expected level for any of these chromosomes, aneuploidy is  suspected.  While we do not consider it a replacement for invasive testing and karyotype analysis, a negative result from this testing would be reassuring, though not a guarantee of a normal chromosome complement for the baby.  An abnormal result is certainly suggestive of an abnormal chromosome complement, though we would still recommend CVS or amniocentesis to confirm any findings from this testing.  We obtained a detailed family history and pregnancy history.  The reported family history is unremarkable for birth defects, mental retardation, recurrent pregnancy loss or known chromosome abnormalities.  The couple is of African American ancestry.  A review of her records show a normal MCV (88) and negative hemoglobin solubility.  This screens for sickle cell only, but not for other hemoglobin conditions.  If additional testing is desired, hemoglobin fractionation would be more informative. Newborn screening in [place] does test for hemoglobinopathies.  Taylor Shannon stated that this is her fourth pregnancy, the third with her husband.  They have a healthy 61 year old daughter and had one ectopic pregnancy.  She has a 30 year old son from a prior relationship.  She reported no complications or exposures in this pregnancy that would be expected to increase the risk for birth defects.  After consideration of the options, Taylor Shannon elected to proceed with InformaSeq testing and an ultrasound today.  We would recommend a detailed anatomy ultrasound at approximately [redacted] weeks gestation and the option of AFP only screening for neural tube defects at 16-20 weeks.  An ultrasound was performed at the time of the visit.  The gestational age was consistent with 12 weeks.  Fetal anatomy could not be assessed due to early gestational age.  Please refer to the ultrasound report for details of that study.  Taylor Shannon was encouraged to call with questions or concerns.  We can be contacted at [phone].  Wilburt Finlay, MS,  CGC   I was present. Danyah Guastella, [place] A, MD

## 2015-01-19 DIAGNOSIS — O24419 Gestational diabetes mellitus in pregnancy, unspecified control: Secondary | ICD-10-CM

## 2015-01-19 HISTORY — DX: Gestational diabetes mellitus in pregnancy, unspecified control: O24.419

## 2015-01-23 ENCOUNTER — Telehealth: Payer: Self-pay | Admitting: Obstetrics and Gynecology

## 2015-01-23 LAB — INFORMASEQ(SM) WITH XY ANALYSIS
Fetal Fraction (%):: 7.5
Fetal Number: 1
Gestational Age at Collection: 12.4 weeks
Weight: 199 [lb_av]

## 2015-01-23 NOTE — Telephone Encounter (Signed)
The patient was informed of the results of her recent InformaSeq testing (performed at [facility]) which yielded NEGATIVE results.  The patient's specimen showed DNA consistent with two copies of chromosomes 21, 18 and 13.  The sensitivity for trisomy 93, trisomy 20 and trisomy 59 using this testing are reported as 99.1%, 98.3% and 98.1% respectively.  Thus, while the results of this testing are highly accurate, they are not considered diagnostic at this time.  Should more definitive information be desired, the patient may still consider amniocentesis.   As requested to know by the patient, sex chromosome analysis was included for this sample.  Results was consistent with a female fetus. This is predicted with >97% accuracy.  A maternal serum AFP only should be considered if screening for neural tube defects is desired.  Wilburt Finlay, MS, CGC

## 2015-02-20 ENCOUNTER — Ambulatory Visit
Admission: RE | Admit: 2015-02-20 | Discharge: 2015-02-20 | Disposition: A | Discharge status: Home / Self Care | Payer: Managed Care, Other (non HMO) | Source: Ambulatory Visit | Attending: Maternal & Fetal Medicine | Admitting: Maternal & Fetal Medicine

## 2015-02-20 VITALS — BP 128/77 | HR 110 | Temp 97.6°F | Resp 18 | Ht 66.0 in | Wt 201.6 lb

## 2015-02-20 DIAGNOSIS — O09521 Supervision of elderly multigravida, first trimester: Secondary | ICD-10-CM | POA: Diagnosis present

## 2015-02-20 DIAGNOSIS — Z3A19 19 weeks gestation of pregnancy: Secondary | ICD-10-CM | POA: Insufficient documentation

## 2015-02-20 DIAGNOSIS — O09522 Supervision of elderly multigravida, second trimester: Secondary | ICD-10-CM

## 2015-03-19 DIAGNOSIS — K8020 Calculus of gallbladder without cholecystitis without obstruction: Secondary | ICD-10-CM

## 2015-03-19 HISTORY — DX: Calculus of gallbladder without cholecystitis without obstruction: K80.20

## 2015-05-01 ENCOUNTER — Ambulatory Visit
Admission: RE | Admit: 2015-05-01 | Discharge: 2015-05-01 | Disposition: A | Discharge status: Home / Self Care | Payer: Managed Care, Other (non HMO) | Source: Ambulatory Visit | Attending: Maternal & Fetal Medicine | Admitting: Maternal & Fetal Medicine

## 2015-05-01 VITALS — BP 133/79 | HR 110 | Temp 98.0°F | Resp 18 | Wt 210.8 lb

## 2015-05-01 DIAGNOSIS — O26893 Other specified pregnancy related conditions, third trimester: Secondary | ICD-10-CM | POA: Insufficient documentation

## 2015-05-01 DIAGNOSIS — O3413 Maternal care for benign tumor of corpus uteri, third trimester: Secondary | ICD-10-CM | POA: Diagnosis not present

## 2015-05-01 DIAGNOSIS — Z3A28 28 weeks gestation of pregnancy: Secondary | ICD-10-CM | POA: Diagnosis not present

## 2015-05-01 DIAGNOSIS — O09523 Supervision of elderly multigravida, third trimester: Secondary | ICD-10-CM | POA: Insufficient documentation

## 2015-05-01 HISTORY — DX: Calculus of gallbladder without cholecystitis without obstruction: K80.20

## 2015-05-16 ENCOUNTER — Encounter: Payer: Self-pay | Admitting: *Deleted

## 2015-05-16 ENCOUNTER — Encounter: Payer: Managed Care, Other (non HMO) | Attending: Obstetrics and Gynecology | Admitting: *Deleted

## 2015-05-16 VITALS — BP 124/70 | Ht 65.0 in | Wt 214.6 lb

## 2015-05-16 DIAGNOSIS — O24419 Gestational diabetes mellitus in pregnancy, unspecified control: Secondary | ICD-10-CM | POA: Diagnosis present

## 2015-05-16 DIAGNOSIS — O24410 Gestational diabetes mellitus in pregnancy, diet controlled: Secondary | ICD-10-CM

## 2015-05-16 NOTE — Progress Notes (Signed)
Diabetes Self-Management Education  Visit Type: First/Initial  Appt. Start Time: 1310 Appt. End Time: 1440  05/16/2015  [name], identified by name and date of birth, is a 40 y.o. female with a diagnosis of Diabetes: Gestational Diabetes.   ASSESSMENT  Blood pressure 124/70, height 5\' 5"  (1.651 m), weight 214 lb 9.6 oz (97.342 kg), last menstrual period 10/14/2014. Body mass index is 35.71 kg/(m^2).      Diabetes Self-Management Education - 05/16/15 1505    Visit Information   Visit Type First/Initial   Initial Visit   Diabetes Type Gestational Diabetes   Are you currently following a meal plan? Yes   What type of meal plan do you follow? low fat, increase vegetables, decrease carbs; unable to eat variety of foods due to N/V   Are you taking your medications as prescribed? Yes   Date Diagnosed 05/12/15   Health Coping   How would you rate your overall health? Good   Psychosocial Assessment   Patient Belief/Attitude about Diabetes Motivated to manage diabetes  "concerned"   Self-care barriers None   Self-management support Doctor's office   Patient Concerns Nutrition/Meal planning;Glycemic Control;Weight Control;Healthy Lifestyle   Special Needs None   Preferred Learning Style Hands on   Learning Readiness Change in progress   How often do you need to have someone help you when you read instructions, pamphlets, or other written materials from your doctor or pharmacy? 1 - Never   What is the last grade level you completed in school? Master's   Pre-Education Assessment   Patient understands the diabetes disease and treatment process. Needs Instruction   Patient understands incorporating nutritional management into lifestyle. Needs Instruction   Patient undertands incorporating physical activity into lifestyle. Needs Instruction   Patient understands using medications safely. Needs Instruction   Patient understands monitoring blood glucose, interpreting and using  results Needs Instruction   Patient understands prevention, detection, and treatment of acute complications. Needs Instruction   Patient understands prevention, detection, and treatment of chronic complications. Needs Instruction   Patient understands how to develop strategies to address psychosocial issues. Needs Instruction   Patient understands how to develop strategies to promote health/change behavior. Needs Instruction   Complications   How often do you check your blood sugar? 0 times/day (not testing)  Provided One Touch Verio Flex and instructed on use. BG upon return demonstration was 143 mg/dL at 2:25 pm - 4 hrs pp. Pt reports only eating a granola bar today due to sickness.    Have you had a dilated eye exam in the past 12 months? Yes   Have you had a dental exam in the past 12 months? Yes   Are you checking your feet? No   Dietary Intake   Breakfast boiled egg, smoothie (can't tolerate milk, yogurt, bread, no meats except chicken, no citrus fruits)   Snack (morning) fruit, granola bar   Lunch left overs from supper   Snack (afternoon) fruit, granola bar, smoothie   Dinner chicken, salad, vegetables   Beverage(s) water   Exercise   Exercise Type ADL's   Patient Education   Previous Diabetes Education No   Disease state  Definition of diabetes, type 1 and 2, and the diagnosis of diabetes;Factors that contribute to the development of diabetes   Nutrition management  Role of diet in the treatment of diabetes and the relationship between the three main macronutrients and blood glucose level   Physical activity and exercise  Role of exercise on diabetes  management, blood pressure control and cardiac health.   Monitoring Taught/evaluated SMBG meter.;Purpose and frequency of SMBG.;Identified appropriate SMBG and/or A1C goals.   Chronic complications Relationship between chronic complications and blood glucose control   Psychosocial adjustment Identified and addressed patients feelings  and concerns about diabetes;Role of stress on diabetes   Preconception care Pregnancy and GDM  Role of pre-pregnancy blood glucose control on the development of the fetus;Reviewed with patient blood glucose goals with pregnancy;Role of family planning for patients with diabetes   Individualized Goals (developed by patient)   Reducing Risk Improve blood sugars Lose weight Lead a healthier lifestyle   Outcomes   Expected Outcomes Demonstrated interest in learning. Expect positive outcomes   Future DMSE 2 wks      Individualized Plan for Diabetes Self-Management Training:   Learning Objective:  Patient will have a greater understanding of diabetes self-management. Patient education plan is to attend individual and/or group sessions per assessed needs and concerns.   Plan:   Patient Instructions  Read booklet on Gestational Diabetes Follow Gestational Meal Planning Guidelines Complete a 3 Day Food Record and bring to next appointment Check blood sugars 4 x day - before breakfast and 2 hrs after every meal and record  Bring blood sugar log to all appointments Call MD for prescription for meter strips and lancets Strips  One Touch Verio Lancets   One Touch Delica Purchase urine ketone strips if blood sugars not controlled and check urine ketones every am:  If + increase bedtime snack to 1 protein and 2 carbohydrate servings Walk 20-30 minutes at least 5 x week if permitted by MD   Expected Outcomes:  Demonstrated interest in learning. Expect positive outcomes  Education material provided:  Gestational Booklet Gestational Meal Planning Guidelines Viewed Gestational Diabetes Video Meter - One Touch Verio Flex 3 Day Food Record Goals for a Healthy Pregnancy  If problems or questions, patient to contact team via:   Johny Drilling, [place], [place], CDE [phone]  Future DSME appointment: 2 wks  Thursday May 29, 2015 with Pam (dietitian)

## 2015-05-16 NOTE — Patient Instructions (Signed)
Read booklet on Gestational Diabetes Follow Gestational Meal Planning Guidelines Complete a 3 Day Food Record and bring to next appointment Check blood sugars 4 x day - before breakfast and 2 hrs after every meal and record  Bring blood sugar log to all appointments Call MD for prescription for meter strips and lancets Strips  One Touch Verio Lancets   One Touch Delica Purchase urine ketone strips if blood sugars not controlled and check urine ketones every am:  If + increase bedtime snack to 1 protein and 2 carbohydrate servings Walk 20-30 minutes at least 5 x week if permitted by MD

## 2015-05-26 ENCOUNTER — Inpatient Hospital Stay
Admission: EM | Admit: 2015-05-26 | Discharge: 2015-05-26 | Disposition: A | Discharge status: Home / Self Care | Payer: Managed Care, Other (non HMO) | Attending: Obstetrics and Gynecology | Admitting: Obstetrics and Gynecology

## 2015-05-26 DIAGNOSIS — O99613 Diseases of the digestive system complicating pregnancy, third trimester: Secondary | ICD-10-CM | POA: Diagnosis not present

## 2015-05-26 DIAGNOSIS — Z3A32 32 weeks gestation of pregnancy: Secondary | ICD-10-CM | POA: Insufficient documentation

## 2015-05-26 DIAGNOSIS — K8020 Calculus of gallbladder without cholecystitis without obstruction: Secondary | ICD-10-CM | POA: Diagnosis not present

## 2015-05-26 DIAGNOSIS — R1013 Epigastric pain: Secondary | ICD-10-CM | POA: Diagnosis present

## 2015-05-26 LAB — COMPREHENSIVE METABOLIC PANEL
ALT: 9 U/L — ABNORMAL LOW (ref 14–54)
ANION GAP: 10 (ref 5–15)
AST: 18 U/L (ref 15–41)
Albumin: 2.9 g/dL — ABNORMAL LOW (ref 3.5–5.0)
Alkaline Phosphatase: 134 U/L — ABNORMAL HIGH (ref 38–126)
BILIRUBIN TOTAL: 0.9 mg/dL (ref 0.3–1.2)
BUN: <5 mg/dL — ABNORMAL LOW (ref 6–20)
CO2: 21 mmol/L — ABNORMAL LOW (ref 22–32)
Calcium: 9.4 mg/dL (ref 8.9–10.3)
Chloride: 105 mmol/L (ref 101–111)
Creatinine, Ser: 0.44 mg/dL (ref 0.44–1.00)
Glucose, Bld: 113 mg/dL — ABNORMAL HIGH (ref 65–99)
POTASSIUM: 2.9 mmol/L — AB (ref 3.5–5.1)
Sodium: 136 mmol/L (ref 135–145)
TOTAL PROTEIN: 6.7 g/dL (ref 6.5–8.1)

## 2015-05-26 LAB — CBC
HEMATOCRIT: 28.7 % — AB (ref 35.0–47.0)
HEMOGLOBIN: 9.6 g/dL — AB (ref 12.0–16.0)
MCH: 27.1 pg (ref 26.0–34.0)
MCHC: 33.4 g/dL (ref 32.0–36.0)
MCV: 81.1 fL (ref 80.0–100.0)
Platelets: 331 10*3/uL (ref 150–440)
RBC: 3.54 MIL/uL — AB (ref 3.80–5.20)
RDW: 17 % — ABNORMAL HIGH (ref 11.5–14.5)
WBC: 8.7 10*3/uL (ref 3.6–11.0)

## 2015-05-26 LAB — LIPASE, BLOOD: Lipase: 53 U/L — ABNORMAL HIGH (ref 11–51)

## 2015-05-26 LAB — PROTEIN / CREATININE RATIO, URINE
CREATININE, URINE: 149 mg/dL
PROTEIN CREATININE RATIO: 0.12 mg/mg{creat} (ref 0.00–0.15)
Total Protein, Urine: 18 mg/dL

## 2015-05-26 LAB — AMYLASE: Amylase: 90 U/L (ref 28–100)

## 2015-05-26 MED ORDER — POTASSIUM CHLORIDE CRYS ER 20 MEQ PO TBCR: 20.0000 meq | EXTENDED_RELEASE_TABLET | Freq: Two times a day (BID) | ORAL | Status: AC

## 2015-05-26 MED ORDER — ZOLPIDEM TARTRATE 5 MG PO TABS: 5.0000 mg | ORAL_TABLET | Freq: Every evening | ORAL | Status: DC | PRN

## 2015-05-26 MED ORDER — ACETAMINOPHEN 325 MG PO TABS: 650.0000 mg | ORAL_TABLET | ORAL | Status: DC | PRN

## 2015-05-26 MED ORDER — POTASSIUM CHLORIDE CRYS ER 20 MEQ PO TBCR
20.0000 meq | EXTENDED_RELEASE_TABLET | Freq: Two times a day (BID) | ORAL | Status: DC
  Administered 2015-05-26: 20 meq via ORAL
  Filled 2015-05-26: qty 1

## 2015-05-26 MED ORDER — PRENATAL MULTIVITAMIN CH: 1.0000 | ORAL_TABLET | Freq: Every day | ORAL | Status: DC

## 2015-05-26 MED ORDER — DOCUSATE SODIUM 100 MG PO CAPS: 100.0000 mg | ORAL_CAPSULE | Freq: Every day | ORAL | Status: DC

## 2015-05-26 MED ORDER — CALCIUM CARBONATE ANTACID 500 MG PO CHEW: 2.0000 | CHEWABLE_TABLET | ORAL | Status: DC | PRN

## 2015-05-26 MED ORDER — OXYCODONE HCL ER 10 MG PO T12A
10.0000 mg | EXTENDED_RELEASE_TABLET | Freq: Two times a day (BID) | ORAL | Status: DC
  Administered 2015-05-26: 10 mg via ORAL
  Filled 2015-05-26: qty 1

## 2015-05-26 MED ORDER — OXYCODONE HCL 5 MG PO TABS: 5.0000 mg | ORAL_TABLET | Freq: Once | ORAL | Status: DC

## 2015-05-26 NOTE — MAU Provider Note (Signed)
TRIAGE VISIT with NST   Taylor Shannon is a 40 y.o. [id number]. She is at [redacted]w[redacted]d gestation.  Indication: Epigastric pain after eating at [employer] 3 hours ago. Known gall stones, s/p general surgery consult with plans for surgery after pregnancy.  S: Resting comfortably. no CTX, no VB. Active fetal movement. No nausea or vomiting. No headache. No new onset swelling. Worsening right sided epigastric pain x1 week, acutely worsening after fatty meal tonight.  O:  BP 132/72 mmHg  Pulse 117  LMP 10/14/2014 No results found for this or any previous visit (from the past 48 hour(s)).   Gen: NAD, AAOx3      Abd: FNTTP      Ext: Non-tender, Nonedmeatous    FHT: 140, mod var, +10 x10 accels, no decels TOCO: quiet SVE:  deferred   A/P:  40 y.o. [id number] [redacted]w[redacted]d with known cholelithiasis in third trimester.   Though BP wnl, will check P:C ratio, liver enzymes, WBC and  Plts.   Oxycontin given 10mg  for pain control  Clear diet  Fetal Wellbeing: Reassuring FHT tracing appropriate for gestational age.

## 2015-05-26 NOTE — Final Progress Note (Signed)
Patient's labs return mostly normal. Some notable exceptions:  Hypokalemia: Pt with chronic hypokalemia, on po K+ at home. Since she is asx, with normal vitals, I have increased her home K+ dose and have given her a po dose now.  Her alk phos is mildly high, which is wnl for pregnancy. Her lipase is also mildly high, but not concerning for acute new event.  Hgb low at 9.6 - pt is on po iron at home. This dose would be reasonable to increase. Her RDW is increased which suggests she is compensating. Encouraged to continue with po iron at home at increased amount- BID-, taken with vitamin C.  However, no concern for PreE. P:C ratio 0.12.  D/c home with po pain meds; call office for appointment in the morning.

## 2015-05-26 NOTE — OB Triage Note (Signed)
Patient presents with complaints of right sided flank pain, states she feels like it is "gall bladder" pain.  States she has been experiencing pain for the last week, but increased since eating [employer].  Patient denies any vaginal bleeding, spotting, cramping or contractions.  No s/s srom reported.  efm and toco applied.  Abdomen soft non-tender.  fhr-stable.  md notified of patient complaint and assessment.

## 2015-05-29 ENCOUNTER — Inpatient Hospital Stay: Admission: RE | Admit: 2015-05-29 | Payer: Self-pay | Source: Ambulatory Visit

## 2015-05-29 ENCOUNTER — Ambulatory Visit: Payer: Self-pay | Admitting: Dietician

## 2015-06-18 ENCOUNTER — Encounter: Payer: Self-pay | Admitting: Dietician

## 2015-06-18 NOTE — Progress Notes (Signed)
Have not heard back from patient to reschedule; sent discharge letter to MD.

## 2015-06-20 LAB — OB RESULTS CONSOLE GC/CHLAMYDIA
Chlamydia: NEGATIVE
Gonorrhea: NEGATIVE

## 2015-06-20 LAB — OB RESULTS CONSOLE GBS: GBS: NEGATIVE

## 2015-07-01 NOTE — H&P (Signed)
Taylor Shannon is a 40 y.o. female presenting for elective repeat LTCS . EDC 07/20/15 based on LMP  And confirming u/s .  Pregnancy complicated by pedunculated fibroid , Cholelithiasis, AMA, A1 GDM  And 2 prior c/s  Pt elects forBTL  History OB History    Gravida Para Term Preterm AB TAB SAB Ectopic Multiple Living   3 2 1 1      2      Past Medical History  Diagnosis Date  . Pregnancy induced hypertension 2002  . Gallstones March 2017  . Gestational diabetes    Past Surgical History  Procedure Laterality Date  . Cesarean section     Family History: family history includes Diabetes in her mother. Social History:  reports that she has never smoked. She has never used smokeless tobacco. She reports that she does not drink alcohol or use illicit drugs.   Prenatal Transfer Tool  Maternal Diabetes: Yes:  Diabetes Type:  Diet controlled Genetic Screening: Normal Maternal Ultrasounds/Referrals: Normal Fetal Ultrasounds or other Referrals:  None Maternal Substance Abuse:  No Significant Maternal Medications:  None Significant Maternal Lab Results:  None Other Comments:  None  ROS    Last menstrual period 10/14/2014. Exam Physical Exam  Lungs CTA  CV RRR  Abd gravid    Prenatal labs: ABO, Rh:  A+ Antibody:  neg  Rubella:  IMM RPR:   neg HBsAg:   Neg HIV:   neg GBS:   neg  Assessment/Plan: Elective repeat LTCS + BTL + removal of fibroid if on a pedunculated stalk  Failure from BTL 1/200/ yr  Risk of the procedure discussed the pt . All questions answered    Bionca Mckey 07/01/2015, 9:36 AM

## 2015-07-02 ENCOUNTER — Inpatient Hospital Stay
Admission: EM | Admit: 2015-07-02 | Discharge: 2015-07-02 | Disposition: A | Discharge status: Home / Self Care | Payer: Managed Care, Other (non HMO) | Attending: Obstetrics and Gynecology | Admitting: Obstetrics and Gynecology

## 2015-07-02 DIAGNOSIS — O26613 Liver and biliary tract disorders in pregnancy, third trimester: Secondary | ICD-10-CM | POA: Insufficient documentation

## 2015-07-02 DIAGNOSIS — Z3A37 37 weeks gestation of pregnancy: Secondary | ICD-10-CM | POA: Insufficient documentation

## 2015-07-02 DIAGNOSIS — K8020 Calculus of gallbladder without cholecystitis without obstruction: Secondary | ICD-10-CM | POA: Diagnosis not present

## 2015-07-02 DIAGNOSIS — O24410 Gestational diabetes mellitus in pregnancy, diet controlled: Secondary | ICD-10-CM | POA: Insufficient documentation

## 2015-07-02 DIAGNOSIS — O26893 Other specified pregnancy related conditions, third trimester: Secondary | ICD-10-CM | POA: Insufficient documentation

## 2015-07-02 DIAGNOSIS — R51 Headache: Secondary | ICD-10-CM | POA: Diagnosis present

## 2015-07-02 DIAGNOSIS — R030 Elevated blood-pressure reading, without diagnosis of hypertension: Secondary | ICD-10-CM | POA: Insufficient documentation

## 2015-07-02 LAB — CBC WITH DIFFERENTIAL/PLATELET
BASOS ABS: 0 10*3/uL (ref 0–0.1)
BASOS PCT: 0 %
EOS ABS: 0 10*3/uL (ref 0–0.7)
Eosinophils Relative: 1 %
HCT: 30.9 % — ABNORMAL LOW (ref 35.0–47.0)
HEMOGLOBIN: 10.1 g/dL — AB (ref 12.0–16.0)
Lymphocytes Relative: 28 %
Lymphs Abs: 1.8 10*3/uL (ref 1.0–3.6)
MCH: 25.9 pg — AB (ref 26.0–34.0)
MCHC: 32.7 g/dL (ref 32.0–36.0)
MCV: 79 fL — ABNORMAL LOW (ref 80.0–100.0)
MONOS PCT: 10 %
Monocytes Absolute: 0.7 10*3/uL (ref 0.2–0.9)
NEUTROS PCT: 61 %
Neutro Abs: 4.1 10*3/uL (ref 1.4–6.5)
PLATELETS: 281 10*3/uL (ref 150–440)
RBC: 3.92 MIL/uL (ref 3.80–5.20)
RDW: 17.4 % — AB (ref 11.5–14.5)
WBC: 6.7 10*3/uL (ref 3.6–11.0)

## 2015-07-02 LAB — COMPREHENSIVE METABOLIC PANEL
ALBUMIN: 2.9 g/dL — AB (ref 3.5–5.0)
ALK PHOS: 258 U/L — AB (ref 38–126)
ALT: 9 U/L — ABNORMAL LOW (ref 14–54)
ANION GAP: 12 (ref 5–15)
AST: 18 U/L (ref 15–41)
BUN: <5 mg/dL — ABNORMAL LOW (ref 6–20)
CALCIUM: 9.4 mg/dL (ref 8.9–10.3)
CHLORIDE: 104 mmol/L (ref 101–111)
CO2: 20 mmol/L — AB (ref 22–32)
Creatinine, Ser: 0.57 mg/dL (ref 0.44–1.00)
GFR calc Af Amer: >60 mL/min (ref >60)
GFR calc non Af Amer: >60 mL/min (ref >60)
GLUCOSE: 72 mg/dL (ref 65–99)
Potassium: 3.1 mmol/L — ABNORMAL LOW (ref 3.5–5.1)
SODIUM: 136 mmol/L (ref 135–145)
Total Bilirubin: 1.4 mg/dL — ABNORMAL HIGH (ref 0.3–1.2)
Total Protein: 6.8 g/dL (ref 6.5–8.1)

## 2015-07-02 LAB — WET PREP, GENITAL
CLUE CELLS WET PREP: NONE SEEN
Sperm: NONE SEEN
TRICH WET PREP: NONE SEEN
YEAST WET PREP: NONE SEEN

## 2015-07-02 LAB — PROTEIN / CREATININE RATIO, URINE
Creatinine, Urine: 203 mg/dL
PROTEIN CREATININE RATIO: 0.17 mg/mg{creat} — AB (ref 0.00–0.15)
TOTAL PROTEIN, URINE: 35 mg/dL

## 2015-07-02 LAB — URIC ACID: Uric Acid, Serum: 5.1 mg/dL (ref 2.3–6.6)

## 2015-07-02 MED ORDER — DIPHENHYDRAMINE HCL 50 MG/ML IJ SOLN
25.0000 mg | Freq: Once | INTRAMUSCULAR | Status: AC
  Administered 2015-07-02: 25 mg via INTRAVENOUS
  Filled 2015-07-02: qty 1

## 2015-07-02 MED ORDER — MAGNESIUM SULFATE 2 GM/50ML IV SOLN
2.0000 g | Freq: Once | INTRAVENOUS | Status: AC
  Administered 2015-07-02: 2 g via INTRAVENOUS
  Filled 2015-07-02: qty 50

## 2015-07-02 MED ORDER — PROCHLORPERAZINE EDISYLATE 5 MG/ML IJ SOLN
10.0000 mg | Freq: Once | INTRAMUSCULAR | Status: AC
  Administered 2015-07-02: 10 mg via INTRAVENOUS
  Filled 2015-07-02: qty 2

## 2015-07-02 MED ORDER — LACTATED RINGERS IV SOLN
INTRAVENOUS | Status: DC
  Administered 2015-07-02: 20:00:00 via INTRAVENOUS

## 2015-07-02 NOTE — H&P (Signed)
  Overnight Observation H&P  Taylor Shannon is a 40 y.o. [id number]. She is at [redacted]w[redacted]d gestation.  Indication: Elevated Blood Pressure and headache. She is also contracting.  S: Resting comfortably. q2-5 min CTX, no VB. Active fetal movement. Denies SOB, new onset swelling, RUQ pain. Endorses moderately severe HA that started several days ago, centered behind left eye, associated with photophobia and nausea - not improved with 1000 mg Tylenol, caffeine and oral hydration. No hx of headaches or migraines. Also noted 2 weeks of hand pain and "tightness", no LE edema. Also noticed visual spots yesterday x1, resolved today.  In clinic yesterday her BP was in the low 130s/80s range. Today in clinic BP was in high 130s/high 80s range- labs from clinic are still pending. She measured her BP at work after her appointment and it was 145/92.  She has a hx of PreE with early delivery at 35wks.   Has been having painful ctx for about a month.  Pregnancy complicated by pedunculated fibroid , Cholelithiasis, AMA, A1 GDM And 2 prior c/s  Pt elects for BTL   Repeat C/S scheduled with Dr. Ouida Sills >39wks.  O:  BP 110/69 mmHg  Pulse 105  Temp(Src) 98.6 F (37 C)  LMP 10/14/2014 No results found for this or any previous visit (from the past 48 hour(s)).   Gen: NAD, AAOx3      Abd: FNTTP      Ext: Non-tender, Nonedmeatous, excellent fetal movement. Pulm: CTAB Reflexes: 2+  FHT: 130s, mod var, +accels, no decels TOCO: q3-5 , palpate mild to moderate. No tenderness over prior C/S scar SVE:  closed, 50, high, posterior and soft  NST: Category I strip, see detailed evaluation above  A/P:  40 y.o. [id number] 110w3d with elevated BP, persistent HA, hx of PreE with 35wk indicated delivery, prior C/S x2 and contractions.                        Labor: Is having painful contractions, but they have been present x1 month and cervix not opening. On exam today they look uncomfortable. Overnight monitoring  warranted.  R/o PreEclampsia: Stat labs pending. Will have serial BP with ambulation.   Headache: If simple migraine will not need delivery. Will treat with migraine cocktail iv and IVF and monitor for resolution.  Fetal Wellbeing: Reassuring Cat 1 tracing.   Overall, I have a low threshold to deliver her because of her persistent HA with elevating trending BP, and prior C/S x2 with contractions now. However, we have no documented BP >140/90. She also is contracting on a prior C/S scar x2, but her cervix, while soft, is still closed and posterior. She is still early term.   For now, I will do overnight monitoring of serial BP, monitor labs and see if HA resolves with iv meds and fluids.

## 2015-07-02 NOTE — Progress Notes (Signed)
Pt returned to room from ambulating in hallway, reports feeling tired having worked today and provided with gingerale and ice per request. Pt switched from birthing bed to regular bed and EFM reapplied. Discussed medications to be administered including Magnesium Sulfate and what to expect. Pt advised to call for assistance when needing to get oob for restroom. Call bell in reach.

## 2015-07-02 NOTE — Progress Notes (Signed)
EFM removed, pt oob to restroom, then out of OBS room to ambulate in hallways x 10-15 mins. Pt reports constant throbbing ha pain still present, light sensitive and burning sensation in RUQ. Denies sob, chest pain, dizziness, or nausea/vomiting. Pt provided with gingerale and ice chips to sip while ambulating.

## 2015-07-02 NOTE — Progress Notes (Signed)
Pt husband returned to room and pt now awake and reports she feels better, "my head is not banging like it was", reviewed plan with pt, says she could just go home and eat/sleept. Pt states she feels fine to go home, next OB clinic appt is mid next week.

## 2015-07-02 NOTE — Progress Notes (Signed)
Dr Leafy Ro paged, explained pt request to go home since she is feeling better, VS remains wnl, stable, headache pain improved. Per MD, BP and lab results wnl and since pt condition has improved it is reasonable for her to be discharged home as requested with pree precautions and instructions to f/u in OB clinic Friday to be reevaluated.

## 2015-07-02 NOTE — OB Triage Note (Signed)
PIV d'ced, VSS, afebrile, FHT reactive and reassuring, pt reports RUQ pain, headache pain and abdominal discomfort during contractions have improved and she has been resting well after medication. Discharge instructions and labor precautions discussed with pt and s/o, reviewed s/s of pre-e that pt should monitor for and report immediately if noted. Advised pt to rest, stay hydrated, and call OB clinic in the morning to schedule an appointment for this Friday to be reevaluated. Understanding and agreement with plan for discharge verbalized. Pt husband present to assist her with getting dressed and then driving her home. Discharge paperwork to pt.

## 2015-07-02 NOTE — Progress Notes (Signed)
Shift report received from S. Carlota Raspberry, RN. Pt is stable, G5P2, 37.3wk in OBS being evaluated with PIH workup, c/o frontal headache, previous c/s, scheduled for repeat, BP since arrival to triage wnl. Orders reviewed per Dr Leafy Ro, start PIV, obtain labwork and hang LR @ 100cc/hr.

## 2015-07-02 NOTE — Plan of Care (Signed)
Pt arrived to labor/delivery with c/o severe frontal headache that started last night. Dr Leafy Ro here to see pt. Will start IV and draw labwork.

## 2015-07-03 ENCOUNTER — Encounter: Payer: Self-pay | Admitting: Obstetrics and Gynecology

## 2015-07-03 NOTE — Progress Notes (Unsigned)
Patient ID: Taylor Shannon, female   DOB: 12-04-1975, 40 y.o.   MRN: [id number]    Final progress note:  Pt PreE labs came back normal, reassuring me that her nausea is not related to HELLP. She had no protein in her urine and her BP were normal in house.  Her headache resolved almost, but not quite, completely with iv pain meds and IVF. She had Cat I strip throughout.  Discharge home in stable condition on 07/02/15.  Benjaman Kindler, MD 07/03/2015 12:39 PM

## 2015-07-09 ENCOUNTER — Inpatient Hospital Stay
Admission: EM | Admit: 2015-07-09 | Discharge: 2015-07-12 | DRG: 765 | Disposition: A | Discharge status: Home / Self Care | Payer: Managed Care, Other (non HMO) | Attending: Obstetrics and Gynecology | Admitting: Obstetrics and Gynecology

## 2015-07-09 ENCOUNTER — Encounter
Admission: EM | Disposition: A | Discharge status: Home / Self Care | Payer: Self-pay | Source: Home / Self Care | Attending: Obstetrics and Gynecology

## 2015-07-09 ENCOUNTER — Inpatient Hospital Stay: Payer: Managed Care, Other (non HMO) | Admitting: Anesthesiology

## 2015-07-09 DIAGNOSIS — O34211 Maternal care for low transverse scar from previous cesarean delivery: Secondary | ICD-10-CM | POA: Diagnosis present

## 2015-07-09 DIAGNOSIS — O2662 Liver and biliary tract disorders in childbirth: Secondary | ICD-10-CM | POA: Diagnosis present

## 2015-07-09 DIAGNOSIS — O24420 Gestational diabetes mellitus in childbirth, diet controlled: Secondary | ICD-10-CM | POA: Diagnosis present

## 2015-07-09 DIAGNOSIS — Z3A38 38 weeks gestation of pregnancy: Secondary | ICD-10-CM

## 2015-07-09 DIAGNOSIS — D62 Acute posthemorrhagic anemia: Secondary | ICD-10-CM | POA: Diagnosis not present

## 2015-07-09 DIAGNOSIS — D259 Leiomyoma of uterus, unspecified: Secondary | ICD-10-CM | POA: Diagnosis present

## 2015-07-09 DIAGNOSIS — Z833 Family history of diabetes mellitus: Secondary | ICD-10-CM | POA: Diagnosis not present

## 2015-07-09 DIAGNOSIS — Z9889 Other specified postprocedural states: Secondary | ICD-10-CM

## 2015-07-09 DIAGNOSIS — O34219 Maternal care for unspecified type scar from previous cesarean delivery: Secondary | ICD-10-CM | POA: Diagnosis present

## 2015-07-09 DIAGNOSIS — K8020 Calculus of gallbladder without cholecystitis without obstruction: Secondary | ICD-10-CM | POA: Diagnosis present

## 2015-07-09 DIAGNOSIS — O9962 Diseases of the digestive system complicating childbirth: Secondary | ICD-10-CM | POA: Diagnosis present

## 2015-07-09 DIAGNOSIS — Z302 Encounter for sterilization: Secondary | ICD-10-CM | POA: Diagnosis not present

## 2015-07-09 DIAGNOSIS — O134 Gestational [pregnancy-induced] hypertension without significant proteinuria, complicating childbirth: Secondary | ICD-10-CM | POA: Diagnosis present

## 2015-07-09 DIAGNOSIS — O09523 Supervision of elderly multigravida, third trimester: Secondary | ICD-10-CM

## 2015-07-09 DIAGNOSIS — O3413 Maternal care for benign tumor of corpus uteri, third trimester: Secondary | ICD-10-CM | POA: Diagnosis present

## 2015-07-09 LAB — TYPE AND SCREEN
ABO/RH(D): A POS
ANTIBODY SCREEN: NEGATIVE

## 2015-07-09 LAB — COMPREHENSIVE METABOLIC PANEL
ALBUMIN: 2.9 g/dL — AB (ref 3.5–5.0)
ALT: 8 U/L — ABNORMAL LOW (ref 14–54)
ANION GAP: 11 (ref 5–15)
AST: 17 U/L (ref 15–41)
Alkaline Phosphatase: 307 U/L — ABNORMAL HIGH (ref 38–126)
BUN: <5 mg/dL — ABNORMAL LOW (ref 6–20)
CALCIUM: 9.4 mg/dL (ref 8.9–10.3)
CHLORIDE: 105 mmol/L (ref 101–111)
CO2: 21 mmol/L — AB (ref 22–32)
Creatinine, Ser: 0.59 mg/dL (ref 0.44–1.00)
GFR calc non Af Amer: >60 mL/min (ref >60)
Glucose, Bld: 67 mg/dL (ref 65–99)
POTASSIUM: 3.5 mmol/L (ref 3.5–5.1)
SODIUM: 137 mmol/L (ref 135–145)
Total Bilirubin: 1.2 mg/dL (ref 0.3–1.2)
Total Protein: 7 g/dL (ref 6.5–8.1)

## 2015-07-09 LAB — CBC WITH DIFFERENTIAL/PLATELET
BASOS ABS: 0 10*3/uL (ref 0–0.1)
Basophils Relative: 0 %
EOS PCT: 1 %
Eosinophils Absolute: 0 10*3/uL (ref 0–0.7)
HEMATOCRIT: 31 % — AB (ref 35.0–47.0)
Hemoglobin: 10.3 g/dL — ABNORMAL LOW (ref 12.0–16.0)
LYMPHS PCT: 19 %
Lymphs Abs: 1.3 10*3/uL (ref 1.0–3.6)
MCH: 25.9 pg — ABNORMAL LOW (ref 26.0–34.0)
MCHC: 33.1 g/dL (ref 32.0–36.0)
MCV: 78.1 fL — AB (ref 80.0–100.0)
MONO ABS: 0.6 10*3/uL (ref 0.2–0.9)
MONOS PCT: 9 %
NEUTROS ABS: 4.7 10*3/uL (ref 1.4–6.5)
NEUTROS PCT: 71 %
PLATELETS: 275 10*3/uL (ref 150–440)
RBC: 3.97 MIL/uL (ref 3.80–5.20)
RDW: 17.4 % — AB (ref 11.5–14.5)
WBC: 6.6 10*3/uL (ref 3.6–11.0)

## 2015-07-09 LAB — PROTEIN / CREATININE RATIO, URINE
Creatinine, Urine: 105 mg/dL
PROTEIN CREATININE RATIO: 0.14 mg/mg{creat} (ref 0.00–0.15)
Total Protein, Urine: 15 mg/dL

## 2015-07-09 LAB — RAPID HIV SCREEN (HIV 1/2 AB+AG)
HIV 1/2 ANTIBODIES: NONREACTIVE
HIV-1 P24 Antigen - HIV24: NONREACTIVE

## 2015-07-09 SURGERY — Surgical Case
Anesthesia: Spinal | Site: Abdomen | Wound class: Clean Contaminated

## 2015-07-09 MED ORDER — FENTANYL CITRATE (PF) 100 MCG/2ML IJ SOLN
25.0000 ug | INTRAMUSCULAR | Status: DC | PRN
  Administered 2015-07-09: 25 ug via INTRAVENOUS
  Filled 2015-07-09: qty 2

## 2015-07-09 MED ORDER — ONDANSETRON HCL 4 MG/2ML IJ SOLN: 4.0000 mg | Freq: Once | INTRAMUSCULAR | Status: DC | PRN

## 2015-07-09 MED ORDER — NALBUPHINE HCL 10 MG/ML IJ SOLN: 5.0000 mg | INTRAMUSCULAR | Status: DC | PRN

## 2015-07-09 MED ORDER — MORPHINE SULFATE (PF) 0.5 MG/ML IJ SOLN
INTRAMUSCULAR | Status: DC | PRN
  Administered 2015-07-09: .2 mg via EPIDURAL

## 2015-07-09 MED ORDER — SOD CITRATE-CITRIC ACID 500-334 MG/5ML PO SOLN: 30.0000 mL | ORAL | Status: DC

## 2015-07-09 MED ORDER — WITCH HAZEL-GLYCERIN EX PADS: 1.0000 "application " | MEDICATED_PAD | CUTANEOUS | Status: DC | PRN

## 2015-07-09 MED ORDER — METHYLERGONOVINE MALEATE 0.2 MG/ML IJ SOLN
INTRAMUSCULAR | Status: AC
  Filled 2015-07-09: qty 1

## 2015-07-09 MED ORDER — BUPIVACAINE HCL (PF) 0.5 % IJ SOLN
INTRAMUSCULAR | Status: AC
  Filled 2015-07-09: qty 30

## 2015-07-09 MED ORDER — NALBUPHINE HCL 10 MG/ML IJ SOLN: 5.0000 mg | Freq: Once | INTRAMUSCULAR | Status: DC | PRN

## 2015-07-09 MED ORDER — SIMETHICONE 80 MG PO CHEW
80.0000 mg | CHEWABLE_TABLET | ORAL | Status: DC | PRN
  Administered 2015-07-10: 80 mg via ORAL

## 2015-07-09 MED ORDER — TETANUS-DIPHTH-ACELL PERTUSSIS 5-2.5-18.5 LF-MCG/0.5 IM SUSP: 0.5000 mL | Freq: Once | INTRAMUSCULAR | Status: DC

## 2015-07-09 MED ORDER — ACETAMINOPHEN 325 MG PO TABS: 650.0000 mg | ORAL_TABLET | ORAL | Status: DC | PRN

## 2015-07-09 MED ORDER — NALOXONE HCL 2 MG/2ML IJ SOSY: 1.0000 ug/kg/h | PREFILLED_SYRINGE | INTRAVENOUS | Status: DC | PRN

## 2015-07-09 MED ORDER — OXYCODONE HCL 5 MG PO TABS
5.0000 mg | ORAL_TABLET | ORAL | Status: DC | PRN
  Administered 2015-07-10: 5 mg via ORAL
  Filled 2015-07-09: qty 1

## 2015-07-09 MED ORDER — BUPIVACAINE 0.25 % ON-Q PUMP SINGLE CATH 400 ML
400.0000 mL | INJECTION | Status: DC
Start: 2015-07-09 — End: 2015-07-12

## 2015-07-09 MED ORDER — NALOXONE HCL 0.4 MG/ML IJ SOLN: 0.4000 mg | INTRAMUSCULAR | Status: DC | PRN

## 2015-07-09 MED ORDER — DIBUCAINE 1 % RE OINT
1.0000 | TOPICAL_OINTMENT | RECTAL | Status: DC | PRN
Start: 2015-07-09 — End: 2015-07-12

## 2015-07-09 MED ORDER — ONDANSETRON HCL 4 MG/2ML IJ SOLN
INTRAMUSCULAR | Status: DC | PRN
  Administered 2015-07-09: 8 mg via INTRAVENOUS

## 2015-07-09 MED ORDER — SIMETHICONE 80 MG PO CHEW
80.0000 mg | CHEWABLE_TABLET | Freq: Three times a day (TID) | ORAL | Status: DC
  Administered 2015-07-09 – 2015-07-11 (×3): 80 mg via ORAL
  Filled 2015-07-09 (×5): qty 1

## 2015-07-09 MED ORDER — PHENYLEPHRINE HCL 10 MG/ML IJ SOLN
INTRAMUSCULAR | Status: DC | PRN
  Administered 2015-07-09 (×2): 100 ug via INTRAVENOUS
  Administered 2015-07-09: 200 ug via INTRAVENOUS
  Administered 2015-07-09 (×4): 100 ug via INTRAVENOUS
  Administered 2015-07-09: 200 ug via INTRAVENOUS
  Administered 2015-07-09: 100 ug via INTRAVENOUS

## 2015-07-09 MED ORDER — SIMETHICONE 80 MG PO CHEW
80.0000 mg | CHEWABLE_TABLET | ORAL | Status: DC
  Administered 2015-07-10 – 2015-07-11 (×2): 80 mg via ORAL
  Filled 2015-07-09: qty 1

## 2015-07-09 MED ORDER — BUPIVACAINE 0.25 % ON-Q PUMP DUAL CATH 400 ML
INJECTION | Status: AC
  Filled 2015-07-09: qty 400

## 2015-07-09 MED ORDER — COCONUT OIL OIL: 1.0000 "application " | TOPICAL_OIL | Status: DC | PRN

## 2015-07-09 MED ORDER — BUPIVACAINE HCL (PF) 0.5 % IJ SOLN
INTRAMUSCULAR | Status: DC | PRN
  Administered 2015-07-09: 10 mL

## 2015-07-09 MED ORDER — DIPHENHYDRAMINE HCL 25 MG PO CAPS: 25.0000 mg | ORAL_CAPSULE | Freq: Four times a day (QID) | ORAL | Status: DC | PRN

## 2015-07-09 MED ORDER — CEFAZOLIN SODIUM-DEXTROSE 2-4 GM/100ML-% IV SOLN
2.0000 g | INTRAVENOUS | Status: DC
  Administered 2015-07-09: 2 g via INTRAVENOUS

## 2015-07-09 MED ORDER — ZOLPIDEM TARTRATE 5 MG PO TABS: 5.0000 mg | ORAL_TABLET | Freq: Every evening | ORAL | Status: DC | PRN

## 2015-07-09 MED ORDER — CEFAZOLIN SODIUM-DEXTROSE 2-4 GM/100ML-% IV SOLN
INTRAVENOUS | Status: AC
  Administered 2015-07-09: 2 g via INTRAVENOUS
  Filled 2015-07-09: qty 100

## 2015-07-09 MED ORDER — IBUPROFEN 600 MG PO TABS
600.0000 mg | ORAL_TABLET | Freq: Four times a day (QID) | ORAL | Status: DC
  Administered 2015-07-09 – 2015-07-12 (×9): 600 mg via ORAL
  Filled 2015-07-09 (×10): qty 1

## 2015-07-09 MED ORDER — EPHEDRINE SULFATE 50 MG/ML IJ SOLN
INTRAMUSCULAR | Status: DC | PRN
  Administered 2015-07-09: 5 mg via INTRAVENOUS
  Administered 2015-07-09 (×2): 10 mg via INTRAVENOUS

## 2015-07-09 MED ORDER — DIPHENHYDRAMINE HCL 50 MG/ML IJ SOLN: 12.5000 mg | INTRAMUSCULAR | Status: DC | PRN

## 2015-07-09 MED ORDER — SCOPOLAMINE 1 MG/3DAYS TD PT72: 1.0000 | MEDICATED_PATCH | Freq: Once | TRANSDERMAL | Status: DC

## 2015-07-09 MED ORDER — BUPIVACAINE 0.25 % ON-Q PUMP SINGLE CATH 400 ML
400.0000 mL | INJECTION | Status: DC
  Filled 2015-07-09: qty 400

## 2015-07-09 MED ORDER — OXYTOCIN 40 UNITS IN LACTATED RINGERS INFUSION - SIMPLE MED
2.5000 [IU]/h | INTRAVENOUS | Status: AC
  Administered 2015-07-09: 2.5 [IU]/h via INTRAVENOUS
  Filled 2015-07-09: qty 1000

## 2015-07-09 MED ORDER — CARBOPROST TROMETHAMINE 250 MCG/ML IM SOLN
INTRAMUSCULAR | Status: AC
  Filled 2015-07-09: qty 1

## 2015-07-09 MED ORDER — MENTHOL 3 MG MT LOZG: 1.0000 | LOZENGE | OROMUCOSAL | Status: DC | PRN

## 2015-07-09 MED ORDER — SOD CITRATE-CITRIC ACID 500-334 MG/5ML PO SOLN
ORAL | Status: AC
  Administered 2015-07-09: 30 mL via ORAL
  Filled 2015-07-09: qty 15

## 2015-07-09 MED ORDER — SENNOSIDES-DOCUSATE SODIUM 8.6-50 MG PO TABS
2.0000 | ORAL_TABLET | ORAL | Status: DC
  Administered 2015-07-09 – 2015-07-11 (×3): 2 via ORAL
  Filled 2015-07-09 (×3): qty 2

## 2015-07-09 MED ORDER — LACTATED RINGERS IV BOLUS (SEPSIS)
1000.0000 mL | Freq: Once | INTRAVENOUS | Status: AC
  Administered 2015-07-09: 1000 mL via INTRAVENOUS

## 2015-07-09 MED ORDER — IBUPROFEN 600 MG PO TABS
600.0000 mg | ORAL_TABLET | Freq: Four times a day (QID) | ORAL | Status: DC | PRN
  Filled 2015-07-09: qty 1

## 2015-07-09 MED ORDER — PRENATAL MULTIVITAMIN CH
1.0000 | ORAL_TABLET | Freq: Every day | ORAL | Status: DC
  Filled 2015-07-09: qty 1

## 2015-07-09 MED ORDER — DIPHENHYDRAMINE HCL 25 MG PO CAPS
25.0000 mg | ORAL_CAPSULE | ORAL | Status: DC | PRN
  Administered 2015-07-09 – 2015-07-10 (×4): 25 mg via ORAL
  Filled 2015-07-09 (×4): qty 1

## 2015-07-09 MED ORDER — CEFAZOLIN SODIUM-DEXTROSE 2-4 GM/100ML-% IV SOLN
2.0000 g | INTRAVENOUS | Status: AC
  Administered 2015-07-09: 2 g via INTRAVENOUS

## 2015-07-09 MED ORDER — OXYTOCIN 40 UNITS IN LACTATED RINGERS INFUSION - SIMPLE MED
INTRAVENOUS | Status: DC | PRN
  Administered 2015-07-09: 500 mL via INTRAVENOUS

## 2015-07-09 MED ORDER — MEPERIDINE HCL 25 MG/ML IJ SOLN: 6.2500 mg | INTRAMUSCULAR | Status: DC | PRN

## 2015-07-09 MED ORDER — ONDANSETRON HCL 4 MG/2ML IJ SOLN: 4.0000 mg | Freq: Three times a day (TID) | INTRAMUSCULAR | Status: DC | PRN

## 2015-07-09 MED ORDER — SODIUM CHLORIDE 0.9% FLUSH: 3.0000 mL | INTRAVENOUS | Status: DC | PRN

## 2015-07-09 MED ORDER — LACTATED RINGERS IV SOLN: INTRAVENOUS | Status: DC

## 2015-07-09 MED ORDER — LACTATED RINGERS IV SOLN
INTRAVENOUS | Status: DC | PRN
  Administered 2015-07-09: 14:00:00 via INTRAVENOUS

## 2015-07-09 MED ORDER — SOD CITRATE-CITRIC ACID 500-334 MG/5ML PO SOLN
30.0000 mL | ORAL | Status: AC
  Administered 2015-07-09: 30 mL via ORAL

## 2015-07-09 MED ORDER — BUPIVACAINE IN DEXTROSE 0.75-8.25 % IT SOLN
INTRATHECAL | Status: DC | PRN
  Administered 2015-07-09: 1.8 mL via INTRATHECAL

## 2015-07-09 SURGICAL SUPPLY — 26 items
BARRIER ADHS 3X4 INTERCEED (GAUZE/BANDAGES/DRESSINGS) ×3 IMPLANT
CANISTER SUCT 3000ML (MISCELLANEOUS) ×3 IMPLANT
CATH KIT ON-Q SILVERSOAK 5IN (CATHETERS) ×6 IMPLANT
CHLORAPREP W/TINT 26ML (MISCELLANEOUS) ×3 IMPLANT
DRSG TELFA 3X8 NADH (GAUZE/BANDAGES/DRESSINGS) ×9 IMPLANT
ELECT CAUTERY BLADE 6.4 (BLADE) ×3 IMPLANT
ELECT REM PT RETURN 9FT ADLT (ELECTROSURGICAL) ×3
ELECTRODE REM PT RTRN 9FT ADLT (ELECTROSURGICAL) ×1 IMPLANT
GAUZE SPONGE 4X4 12PLY STRL (GAUZE/BANDAGES/DRESSINGS) ×6 IMPLANT
GLOVE BIO SURGEON STRL SZ8 (GLOVE) ×3 IMPLANT
GOWN STRL REUS W/ TWL LRG LVL3 (GOWN DISPOSABLE) ×2 IMPLANT
GOWN STRL REUS W/ TWL XL LVL3 (GOWN DISPOSABLE) ×2 IMPLANT
GOWN STRL REUS W/TWL LRG LVL3 (GOWN DISPOSABLE) ×4
GOWN STRL REUS W/TWL XL LVL3 (GOWN DISPOSABLE) ×4
LIQUID BAND (GAUZE/BANDAGES/DRESSINGS) ×3 IMPLANT
NS IRRIG 1000ML POUR BTL (IV SOLUTION) ×3 IMPLANT
PACK C SECTION AR (MISCELLANEOUS) ×3 IMPLANT
PAD OB MATERNITY 4.3X12.25 (PERSONAL CARE ITEMS) ×3 IMPLANT
PAD PREP 24X41 OB/GYN DISP (PERSONAL CARE ITEMS) ×3 IMPLANT
STAPLER INSORB 30 2030 C-SECTI (MISCELLANEOUS) ×3 IMPLANT
STRAP SAFETY BODY (MISCELLANEOUS) ×3 IMPLANT
SUCT VACUUM KIWI BELL (SUCTIONS) ×3 IMPLANT
SUT CHROMIC 1 CTX 36 (SUTURE) ×9 IMPLANT
SUT PLAIN GUT 0 (SUTURE) ×6 IMPLANT
SUT VIC AB 0 CT1 36 (SUTURE) ×9 IMPLANT
SUT VIC AB 2-0 CT1 36 (SUTURE) ×6 IMPLANT

## 2015-07-09 NOTE — H&P (Signed)
Taylor Shannon is a 40 y.o. female presenting for elevated BP N/V for 2 days increasing painful CTX .2 prior c/s . CTX q 3 min and rates 7/10. A1GDM with good control  Cholelithiasis noted in pregnancy . 7 cm pedunculated fibroid  History OB History    Gravida Para Term Preterm AB TAB SAB Ectopic Multiple Living   3 2 1 1      2      Past Medical History  Diagnosis Date  . Pregnancy induced hypertension 2002  . Gallstones March 2017  . Gestational diabetes    Past Surgical History  Procedure Laterality Date  . Cesarean section     Family History: family history includes Diabetes in her mother. Social History:  reports that she has never smoked. She has never used smokeless tobacco. She reports that she does not drink alcohol or use illicit drugs.   Prenatal Transfer Tool  Maternal Diabetes: Yes:  Diabetes Type:  Diet controlled Genetic Screening: Normal Maternal Ultrasounds/Referrals: Normal Fetal Ultrasounds or other Referrals:  None Maternal Substance Abuse:  No Significant Maternal Medications:  None Significant Maternal Lab Results:  None Other Comments:  None  ROS    Last menstrual period 10/14/2014. Exam Physical Exam  Lungs CTA  CV RRR ABd gravid , NT   Prenatal labs: ABO, Rh:  A+ Antibody:  nrg Rubella:  imm RPR:   NR HBsAg:   neg HIV:   not done GBS:   neg   Assessment/Plan: Active painful CTX , week long H/A , elevated BP in office .  Proceed with repeat LTCS + BTL ( pt reconfirms her desire for BTL) and possible removal of pedunculated fibroid. On-Q pump placement Pt has been counseled regarding the risks of the suregery and possible blood transfusion    Taylor Shannon 07/09/2015, 1:48 PM

## 2015-07-09 NOTE — Brief Op Note (Signed)
07/09/2015  3:21 PM  PATIENT:  Taylor Shannon  40 y.o. female  PRE-OPERATIVE DIAGNOSIS:active contractions   38+3 weeks  Previous c/s  repeat c-scetion,right tubal ligation, removal of fibroid  POST-OPERATIVE DIAGNOSIS:  Same as above    PROCEDURE:  Procedure(s): CESAREAN SECTION, tubal ligation, removal of fibroid (N/A)   Repeat LTCS , right partial salpingectomy , removal of a pedunculated fibroid, On q pump placement  SURGEON:  Surgeon(s) and Role:    Boykin Nearing, MD - Primary  PHYSICIAN ASSISTANT: CNM M . Sigmon   ASSISTANTS:  scrub tech   ANESTHESIA:   spinal  EBL:  Total I/O In: 500 [I.V.:500] Out: 600 [Urine:100; Blood:500]  IOF 1000cc  BLOOD ADMINISTERED:none  DRAINS: Urinary Catheter (Foley)   LOCAL MEDICATIONS USED:  MARCAINE     SPECIMEN:  Source of Specimen:  fibroid  DISPOSITION OF SPECIMEN:  PATHOLOGY  COUNTS:  YES  TOURNIQUET:  * No tourniquets in log *  DICTATION: .Other Dictation: Dictation Number verbal  PLAN OF CARE: Admit to inpatient   PATIENT DISPOSITION:  PACU - hemodynamically stable.   Delay start of Pharmacological VTE agent (>24hrs) due to surgical blood loss or risk of bleeding: not applicable

## 2015-07-09 NOTE — Anesthesia Preprocedure Evaluation (Deleted)
Anesthesia Evaluation  Patient identified by MRN, date of birth, ID band Patient awake    History of Anesthesia Complications Negative for: history of anesthetic complications  Airway Mallampati: II       Dental  (+) Teeth Intact   Pulmonary neg pulmonary ROS,    breath sounds clear to auscultation       Cardiovascular Exercise Tolerance: Good hypertension,  Rhythm:Regular Rate:Normal     Neuro/Psych negative neurological ROS     GI/Hepatic negative GI ROS, Neg liver ROS,   Endo/Other  diabetes, Type 2  Renal/GU negative Renal ROS     Musculoskeletal   Abdominal (+) + obese,   Peds  Hematology negative hematology ROS (+)   Anesthesia Other Findings   Reproductive/Obstetrics                             Anesthesia Physical Anesthesia Plan  ASA: II and emergent  Anesthesia Plan: Spinal   Post-op Pain Management:    Induction:   Airway Management Planned: Natural Airway and Nasal Cannula  Additional Equipment:   Intra-op Plan:   Post-operative Plan:   Informed Consent: I have reviewed the patients History and Physical, chart, labs and discussed the procedure including the risks, benefits and alternatives for the proposed anesthesia with the patient or authorized representative who has indicated his/her understanding and acceptance.     Plan Discussed with: CRNA  Anesthesia Plan Comments:         Anesthesia Quick Evaluation

## 2015-07-09 NOTE — Transfer of Care (Signed)
Immediate Anesthesia Transfer of Care Note  Patient: Science writer  Procedure(s) Performed: Procedure(s): CESAREAN SECTION, tubal ligation, removal of fibroid (N/A)  Patient Location: PACU  Anesthesia Type:Spinal  Level of Consciousness: awake, alert  and oriented  Airway & Oxygen Therapy: Patient Spontanous Breathing  Post-op Assessment: Report given to RN and Post -op Vital signs reviewed and stable  Post vital signs: Reviewed and stable  Last Vitals:  Filed Vitals:   07/09/15 1324 07/09/15 1339  BP: 135/94 139/90  Pulse: 86 97  Temp:    Resp:      Last Pain: There were no vitals filed for this visit.       Complications: No apparent anesthesia complications

## 2015-07-09 NOTE — Anesthesia Procedure Notes (Addendum)
Spinal Patient location during procedure: OR Start time: 07/09/2015 2:25 PM End time: 07/09/2015 2:30 PM Reason for block: at surgeon's request Staffing Anesthesiologist: Marline Backbone F Performed by: anesthesiologist  Preanesthetic Checklist Completed: patient identified, site marked, surgical consent, pre-op evaluation, timeout performed, IV checked, risks and benefits discussed, monitors and equipment checked and at surgeon's request Spinal Block Patient position: sitting Prep: Betadine Patient monitoring: heart rate and blood pressure Approach: midline Location: L3-4 Needle Needle type: Quincke  Needle gauge: 25 G Needle length: 9 cm Needle insertion depth: 6 cm Assessment Sensory level: T6  Date/Time: 07/09/2015 2:47 PM Performed by: Nelda Marseille Pre-anesthesia Checklist: Patient identified, Emergency Drugs available, Suction available, Patient being monitored and Timeout performed Oxygen Delivery Method: Nasal cannula

## 2015-07-09 NOTE — Anesthesia Preprocedure Evaluation (Signed)
Anesthesia Evaluation  Patient identified by MRN, date of birth, ID band Patient awake    Reviewed: Allergy & Precautions  History of Anesthesia Complications Negative for: history of anesthetic complications  Airway Mallampati: II       Dental  (+) Teeth Intact   Pulmonary neg pulmonary ROS,    breath sounds clear to auscultation       Cardiovascular Exercise Tolerance: Good hypertension,  Rhythm:Regular Rate:Normal     Neuro/Psych    GI/Hepatic negative GI ROS, Neg liver ROS,   Endo/Other  diabetes, Type 2  Renal/GU negative Renal ROS     Musculoskeletal negative musculoskeletal ROS (+)   Abdominal (+) + obese,   Peds  Hematology negative hematology ROS (+)   Anesthesia Other Findings   Reproductive/Obstetrics                             Anesthesia Physical Anesthesia Plan  ASA: II and emergent  Anesthesia Plan: Spinal   Post-op Pain Management:    Induction:   Airway Management Planned: Natural Airway and Nasal Cannula  Additional Equipment:   Intra-op Plan:   Post-operative Plan:   Informed Consent: I have reviewed the patients History and Physical, chart, labs and discussed the procedure including the risks, benefits and alternatives for the proposed anesthesia with the patient or authorized representative who has indicated his/her understanding and acceptance.     Plan Discussed with: CRNA  Anesthesia Plan Comments:         Anesthesia Quick Evaluation

## 2015-07-09 NOTE — Op Note (Signed)
Taylor Shannon, Taylor Shannon              ACCOUNT NO.:  [id number]  MEDICAL RECORD NO.:  [id number]  LOCATION:  339A                         FACILITY:  [facility]  PHYSICIAN:  Laverta Baltimore, MDDATE OF BIRTH:  02/10/1975  DATE OF PROCEDURE:  07/09/2015 DATE OF DISCHARGE:                              OPERATIVE REPORT   PREOPERATIVE DIAGNOSIS: 1. 40+ 3 weeks estimated gestational age. 2. Active contractions. 3. Two prior cesarean sections. 4. Elevated blood pressure. 5. Migraine headache. 6. Elective sterilization. 7. Pedunculated uterine fibroid.  POSTOPERATIVE DIAGNOSIS: 1. 40+ 3 weeks estimated gestational age. 2. Active contractions. 3. Two prior cesarean sections. 4. Elevated blood pressure. 5. Migraine headache. 6. Elective sterilization. 7. Pedunculated uterine fibroid.  PROCEDURE: 1. Repeat low transverse cesarean section. 2. Right partial salpingectomy. 3. Removal of pedunculated uterine fibroid. 4. Placement of On-Q pump.  SURGEON:  Laverta Baltimore, MD  FIRST ASSISTANT:  Lars Pinks, certified nurse midwife.  ANESTHESIA:  Spinal.  INDICATION:  A 40 year old, gravida 3, para 2, patient at 40+ 3 weeks estimated gestational age, was seen in the clinic the day of the procedure with an unremitting migraine headache for the past 5 days. The patient had 2 days of nausea and vomiting and patient's blood pressures diastolic of 90.  Patient was sent to Labor and Delivery where regular painful contractions were noted every 3 minutes.  The patient had previously elected for sterilization and reconfirms this.  The patient was known to have a 7 cm what appeared to be pedunculated fibroids and the plans are to remove this fibroid at the time of surgery if it looks like it is not on a broad base.  DESCRIPTION OF PROCEDURE:  After adequate spinal anesthesia, patient was placed in dorsal supine position.  Hip rolled on the right side.  The patient's abdomen was prepped  and draped in normal sterile fashion.  She received 2 g IV Ancef prior to commencement of the case.  Time-out was performed.  A Pfannenstiel incision was made 2 fingerbreadths above the symphysis pubis.  Sharp dissection was used to identify the fascia. Fascia was opened in the midline and opened in a transverse fashion. Superior aspect of the fascia was grasped with Kocher clamps and the recti muscles were dissected free.  Inferior aspect of the fascia was grasped with Kocher clamps and the pyramidalis muscle was dissected free.  Entry into the peritoneal cavity was accomplished sharply. Vesicular uterine peritoneal fold was identified.  A bladder flap was created.  The bladder was reflected inferiorly.  Low transverse uterine incision was made upon entry into the endometrial cavity.  Clear fluid resulted.  The vacuum was applied to the fetal head and with 1 gentle pull, large head was delivered.  Vacuum was removed.  Shoulders and body were removed without difficulty.  Delayed cord clamping for 1 minute was performed while drying the baby.  Apgars were assigned of 8 and 9. Vigorous female who weighed 4400 g 9 pounds 11 ounces.  Placenta was manually delivered and the uterus was exteriorized.  The endometrial cavity was wiped clean with laparotomy tape and the cervix was opened with a ring forceps.  Uterine incision was closed with 1 chromic suture  in a running, locking fashion.  Good approximation of the edges.  The 7 cm pedunculated fibroid was noted to the right midportion of the uterus, its base, i.e., stalk was approximately 3 cm.  Kelly clamp was placed at the base and the fibroid was removed and figure-of-eight suture x3 were required for good hemostasis of the stalk.  Attention was then directed to the patient's right fallopian tube which was doubly clamped with Kelly clamps, and the distal 50% of the fallopian tube was removed. 2-0 Vicryl was used to ligate the base.  Good  hemostasis noted.  The patient was status post a left salpingectomy, therefore there was no further fallopian tube to remove on the patient's left.  The posterior cul-de- sac was suctioned and irrigated.  Uterus was placed back in the abdominal cavity.  Fallopian tube site appeared hemostatic and the pedicle from where the fibroid was removed also appeared hemostatic. The uterine incision appeared hemostatic.  The uterine incision was covered with Interceed in a T-shaped fashion.  The superior aspect of the fascia was regrasped with Kocher clamps, and a single On-Q pump catheters were advanced from the infraumbilical region.  The fascia was closed over top this catheter with 0 Vicryl suture in a running, nonlocking fashion.  Good approximation of the edges.  Good hemostasis noted.  Subcutaneous tissues were irrigated and the skin was bovied. The inferior aspect of the incision was undermined to free up the skin and the skin was brought together with Insorb absorbable staples.  Good cosmetic effect was noted.  The On-Q pump catheter was secured at the skin level and Steri-Strips to the skin.  Tegaderm placed over top and the catheter was loaded with 5 mL of 0.5% Marcaine.  There were no complications.  Patient tolerated the procedure well.  ESTIMATED BLOOD LOSS:  500 mL.  INTRAOPERATIVE FLUIDS:  1000 mL.  URINE OUTPUT:  100 mL.  The patient was taken to recovery room in good condition.          ______________________________ Laverta Baltimore, MD     TS/MEDQ  D:  07/09/2015  T:  07/09/2015  Job:  [id number]

## 2015-07-10 ENCOUNTER — Encounter: Payer: Self-pay | Admitting: Obstetrics and Gynecology

## 2015-07-10 LAB — CBC
HCT: 28 % — ABNORMAL LOW (ref 35.0–47.0)
Hemoglobin: 9.3 g/dL — ABNORMAL LOW (ref 12.0–16.0)
MCH: 25.8 pg — AB (ref 26.0–34.0)
MCHC: 33.3 g/dL (ref 32.0–36.0)
MCV: 77.5 fL — ABNORMAL LOW (ref 80.0–100.0)
PLATELETS: 231 10*3/uL (ref 150–440)
RBC: 3.62 MIL/uL — ABNORMAL LOW (ref 3.80–5.20)
RDW: 17.6 % — AB (ref 11.5–14.5)
WBC: 9.4 10*3/uL (ref 3.6–11.0)

## 2015-07-10 LAB — RPR: RPR Ser Ql: NONREACTIVE

## 2015-07-10 MED FILL — Bupivacaine HCl Inj 0.25%: Qty: 400 | Status: AC

## 2015-07-10 NOTE — Anesthesia Post-op Follow-up Note (Signed)
  Anesthesia Pain Follow-up Note  Patient: Taylor Shannon  Day #: 1  Date of Follow-up: 07/10/2015 Time: 7:43 AM  Last Vitals:  Filed Vitals:   07/10/15 0400 07/10/15 0729  BP: 126/69 124/80  Pulse: 88 91  Temp: 36.7 C 36.7 C  Resp: 18 18    Level of Consciousness: alert  Pain: mild   Side Effects:Pruritis  Catheter Site Exam:clean, dry, no drainage     Plan: Continue current therapy  Buckner Malta

## 2015-07-10 NOTE — Anesthesia Postprocedure Evaluation (Signed)
Anesthesia Post Note  Patient: Science writer  Procedure(s) Performed: Procedure(s) (LRB): CESAREAN SECTION, tubal ligation, removal of fibroid (N/A)  Patient location during evaluation: Nursing Unit Anesthesia Type: Spinal Level of consciousness: awake and alert Pain management: pain level controlled Vital Signs Assessment: post-procedure vital signs reviewed and stable Respiratory status: spontaneous breathing and respiratory function stable Cardiovascular status: blood pressure returned to baseline Postop Assessment: no headache Anesthetic complications: no Comments: Sitting up in bed holding infant, foley removed this morning, has not voided, c/o mild itching that is tolerable.    Last Vitals:  Filed Vitals:   07/10/15 0400 07/10/15 0729  BP: 126/69 124/80  Pulse: 88 91  Temp: 36.7 C 36.7 C  Resp: 18 18    Last Pain:  Filed Vitals:   07/10/15 0729  PainSc: 0-No pain                 Buckner Malta

## 2015-07-10 NOTE — Progress Notes (Signed)
Subjective:   Feeling well and wants to poss go home on Sat if able  Objective:  Blood pressure 124/80, pulse 91, temperature 98.1 F (36.7 C), temperature source Oral, resp. rate 18, height 5' 5"  (1.651 m), weight 217 lb (98.431 kg), last menstrual period 10/14/2014, SpO2 100 %.  General: NAD Pulmonary: no increased work of breathing, Resp reg and non-labored. Lungs: CTA bilat, no W/R/R.  Heart: S1S2, RRR, no M/R/G. Abdomen: non-distended, non-tender, fundus firm at level of umbilicus Incision:C/D/I, On-Q pump intact.  Extremities: no edema, no erythema, no tenderness  Results for orders placed or performed during the hospital encounter of 07/09/15 (from the past 72 hour(s))  Comprehensive metabolic panel     Status: Abnormal   Collection Time: 07/09/15  1:11 PM  Result Value Ref Range   Sodium 137 135 - 145 mmol/L   Potassium 3.5 3.5 - 5.1 mmol/L   Chloride 105 101 - 111 mmol/L   CO2 21 (L) 22 - 32 mmol/L   Glucose, Bld 67 65 - 99 mg/dL   BUN <5 (L) 6 - 20 mg/dL   Creatinine, Ser 0.59 0.44 - 1.00 mg/dL   Calcium 9.4 8.9 - 10.3 mg/dL   Total Protein 7.0 6.5 - 8.1 g/dL   Albumin 2.9 (L) 3.5 - 5.0 g/dL   AST 17 15 - 41 U/L   ALT 8 (L) 14 - 54 U/L   Alkaline Phosphatase 307 (H) 38 - 126 U/L   Total Bilirubin 1.2 0.3 - 1.2 mg/dL   GFR calc non Af Amer >60 >60 mL/min   GFR calc Af Amer >60 >60 mL/min    Comment: (NOTE) The eGFR has been calculated using the CKD EPI equation. This calculation has not been validated in all clinical situations. eGFR's persistently <60 mL/min signify possible Chronic Kidney Disease.    Anion gap 11 5 - 15  CBC with Differential/Platelet     Status: Abnormal   Collection Time: 07/09/15  1:11 PM  Result Value Ref Range   WBC 6.6 3.6 - 11.0 K/uL   RBC 3.97 3.80 - 5.20 MIL/uL   Hemoglobin 10.3 (L) 12.0 - 16.0 g/dL   HCT 31.0 (L) 35.0 - 47.0 %   MCV 78.1 (L) 80.0 - 100.0 fL   MCH 25.9 (L) 26.0 - 34.0 pg   MCHC 33.1 32.0 - 36.0 g/dL   RDW 17.4  (H) 11.5 - 14.5 %   Platelets 275 150 - 440 K/uL   Neutrophils Relative % 71 %   Neutro Abs 4.7 1.4 - 6.5 K/uL   Lymphocytes Relative 19 %   Lymphs Abs 1.3 1.0 - 3.6 K/uL   Monocytes Relative 9 %   Monocytes Absolute 0.6 0.2 - 0.9 K/uL   Eosinophils Relative 1 %   Eosinophils Absolute 0.0 0 - 0.7 K/uL   Basophils Relative 0 %   Basophils Absolute 0.0 0 - 0.1 K/uL  Protein / creatinine ratio, urine     Status: None   Collection Time: 07/09/15  1:11 PM  Result Value Ref Range   Creatinine, Urine 105 mg/dL   Total Protein, Urine 15 mg/dL    Comment: NO NORMAL RANGE ESTABLISHED FOR THIS TEST   Protein Creatinine Ratio 0.14 0.00 - 0.15 mg/mg[Cre]  Type and screen [facility]     Status: None   Collection Time: 07/09/15  1:11 PM  Result Value Ref Range   ABO/RH(D) A POS    Antibody Screen NEG    Sample Expiration  07/12/2015   Rapid HIV screen (HIV 1/2 Ab+Ag)     Status: None   Collection Time: 07/09/15  1:12 PM  Result Value Ref Range   HIV-1 P24 Antigen - HIV24 NON REACTIVE NON REACTIVE   HIV 1/2 Antibodies NON REACTIVE NON REACTIVE   Interpretation (HIV Ag Ab)      A non reactive test result means that HIV 1 or HIV 2 antibodies and HIV 1 p24 antigen were not detected in the specimen.  RPR     Status: None   Collection Time: 07/09/15  1:12 PM  Result Value Ref Range   RPR Ser Ql Non Reactive Non Reactive    Comment: (NOTE) Performed At: [facility] [place], [place] [id number] Lindon Romp MD PJ:[id number]   CBC     Status: Abnormal   Collection Time: 07/10/15  5:59 AM  Result Value Ref Range   WBC 9.4 3.6 - 11.0 K/uL   RBC 3.62 (L) 3.80 - 5.20 MIL/uL   Hemoglobin 9.3 (L) 12.0 - 16.0 g/dL   HCT 28.0 (L) 35.0 - 47.0 %   MCV 77.5 (L) 80.0 - 100.0 fL   MCH 25.8 (L) 26.0 - 34.0 pg   MCHC 33.3 32.0 - 36.0 g/dL   RDW 17.6 (H) 11.5 - 14.5 %   Platelets 231 150 - 440 K/uL     Assessment:   40 y.o. [id number] postoperativeday # 1  LTCS/BTL with Gest HTN, A1GDM, Cholithiasis/Gallstones, Fibroid   Plan:  1) Acute blood loss anemia - hemodynamically stable and asymptomatic - po ferrous sulfate, BP stable, I&O:3000in/1950 out  2) Hgb 9.3/28 HCT  3) TDAP status: not found in records, will check at office.  4) Breast  5) Disposition: plan to dc home on Sat or Sunday

## 2015-07-11 ENCOUNTER — Other Ambulatory Visit: Payer: Self-pay

## 2015-07-11 LAB — SURGICAL PATHOLOGY

## 2015-07-11 NOTE — Progress Notes (Signed)
Subjective:   Feels well and taling about going home tomorrow   Objective:  Blood pressure 150/87, pulse 119, temperature 99.5 F (37.5 C), temperature source Oral, resp. rate 18, height _0  (1.651 m), weight 217 lb (98.431 kg), last menstrual period 10/14/2014, SpO2 97 %.  General: NAD] Heart: S1S2, RRR, No M/R/G Pulmonary: no increased work of breathing, Lungs CTA bilat, no W/R/R. Abdomen: Slight distension, non-tender, fundus firm at level of umbilicus, passing flatus Incision:D&I, on-Q intact Extremities: no edema, no erythema, no tenderness, Neg Homans  Results for orders placed or performed during the hospital encounter of 07/09/15 (from the past 72 hour(s))  Comprehensive metabolic panel     Status: Abnormal   Collection Time: 07/09/15  1:11 PM  Result Value Ref Range   Sodium 137 135 - 145 mmol/L   Potassium 3.5 3.5 - 5.1 mmol/L   Chloride 105 101 - 111 mmol/L   CO2 21 (L) 22 - 32 mmol/L   Glucose, Bld 67 65 - 99 mg/dL   BUN <5 (L) 6 - 20 mg/dL   Creatinine, Ser 0.59 0.44 - 1.00 mg/dL   Calcium 9.4 8.9 - 10.3 mg/dL   Total Protein 7.0 6.5 - 8.1 g/dL   Albumin 2.9 (L) 3.5 - 5.0 g/dL   AST 17 15 - 41 U/L   ALT 8 (L) 14 - 54 U/L   Alkaline Phosphatase 307 (H) 38 - 126 U/L   Total Bilirubin 1.2 0.3 - 1.2 mg/dL   GFR calc non Af Amer >60 >60 mL/min   GFR calc Af Amer >60 >60 mL/min    Comment: (NOTE) The eGFR has been calculated using the CKD EPI equation. This calculation has not been validated in all clinical situations. eGFR's persistently <60 mL/min signify possible Chronic Kidney Disease.    Anion gap 11 5 - 15  CBC with Differential/Platelet     Status: Abnormal   Collection Time: 07/09/15  1:11 PM  Result Value Ref Range   WBC 6.6 3.6 - 11.0 K/uL   RBC 3.97 3.80 - 5.20 MIL/uL   Hemoglobin 10.3 (L) 12.0 - 16.0 g/dL   HCT 31.0 (L) 35.0 - 47.0 %   MCV 78.1 (L) 80.0 - 100.0 fL   MCH 25.9 (L) 26.0 - 34.0 pg   MCHC 33.1 32.0 - 36.0 g/dL   RDW 17.4 (H) 11.5  - 14.5 %   Platelets 275 150 - 440 K/uL   Neutrophils Relative % 71 %   Neutro Abs 4.7 1.4 - 6.5 K/uL   Lymphocytes Relative 19 %   Lymphs Abs 1.3 1.0 - 3.6 K/uL   Monocytes Relative 9 %   Monocytes Absolute 0.6 0.2 - 0.9 K/uL   Eosinophils Relative 1 %   Eosinophils Absolute 0.0 0 - 0.7 K/uL   Basophils Relative 0 %   Basophils Absolute 0.0 0 - 0.1 K/uL  Protein / creatinine ratio, urine     Status: None   Collection Time: 07/09/15  1:11 PM  Result Value Ref Range   Creatinine, Urine 105 mg/dL   Total Protein, Urine 15 mg/dL    Comment: NO NORMAL RANGE ESTABLISHED FOR THIS TEST   Protein Creatinine Ratio 0.14 0.00 - 0.15 mg/mg[Cre]  Type and screen [facility]     Status: None   Collection Time: 07/09/15  1:11 PM  Result Value Ref Range   ABO/RH(D) A POS    Antibody Screen NEG    Sample Expiration 07/12/2015   Rapid HIV  screen (HIV 1/2 Ab+Ag)     Status: None   Collection Time: 07/09/15  1:12 PM  Result Value Ref Range   HIV-1 P24 Antigen - HIV24 NON REACTIVE NON REACTIVE   HIV 1/2 Antibodies NON REACTIVE NON REACTIVE   Interpretation (HIV Ag Ab)      A non reactive test result means that HIV 1 or HIV 2 antibodies and HIV 1 p24 antigen were not detected in the specimen.  RPR     Status: None   Collection Time: 07/09/15  1:12 PM  Result Value Ref Range   RPR Ser Ql Non Reactive Non Reactive    Comment: (NOTE) Performed At: [facility] [place], [place] [id number] Lindon Romp MD [id number]   CBC     Status: Abnormal   Collection Time: 07/10/15  5:59 AM  Result Value Ref Range   WBC 9.4 3.6 - 11.0 K/uL   RBC 3.62 (L) 3.80 - 5.20 MIL/uL   Hemoglobin 9.3 (L) 12.0 - 16.0 g/dL   HCT 28.0 (L) 35.0 - 47.0 %   MCV 77.5 (L) 80.0 - 100.0 fL   MCH 25.8 (L) 26.0 - 34.0 pg   MCHC 33.3 32.0 - 36.0 g/dL   RDW 17.6 (H) 11.5 - 14.5 %   Platelets 231 150 - 440 K/uL     Assessment:   40 y.o. [id number] postoperativeday # 2 stable,  BP WNL.    Plan:  1) Acute blood loss anemia - hemodynamically stable and asymptomatic - po ferrous sulfate  2) --/--/A POS (06/21 1311) /   / Varicella immune  3) TDAP status: UTD  4) BP stable  5) Disposition: Continue orders

## 2015-07-11 NOTE — Lactation Note (Signed)
This note was copied from a baby's chart. Lactation Consultation Note  Patient Name: Taylor Shannon [id number] Date: 07/11/2015     Maternal Data   Mother has decided not to breastfeed, assistance offered but pt and father of baby have made decision Feeding    LATCH Score/Interventions                      Lactation Tools Discussed/Used     Consult Status      Taylor Shannon 07/11/2015, 3:34 PM

## 2015-07-12 ENCOUNTER — Encounter: Payer: Self-pay | Admitting: Emergency Medicine

## 2015-07-12 MED ORDER — OXYCODONE HCL 5 MG PO TABS: 5.0000 mg | ORAL_TABLET | ORAL | Status: AC | PRN

## 2015-07-12 NOTE — Progress Notes (Signed)
Patient understands all discharge instructions and the need to make follow up appointments. Patient discharge via wheelchair with auxillary. 

## 2015-07-12 NOTE — Discharge Summary (Signed)
OB Discharge Summary     Patient Name: Taylor Shannon DOB: [birth year] MRN: [id number]  Date of admission: 07/09/2015 Delivering MD: Boykin Nearing   Date of discharge: 07/12/2015  Admitting diagnosis: contractions 38 wks preg bbb Intrauterine pregnancy: [redacted]w[redacted]d     Secondary diagnosis:  Active Problems:   Labor and delivery indication for care or intervention   Previous cesarean delivery affecting pregnancy   Post-operative state  Additional problems: elevated BPs, possible cHTN     Discharge diagnosis: Term Pregnancy Delivered                                                                                                Post partum procedures: none  Complications: None  Hospital course:  Sceduled C/S   40 y.o. yo [id number] at [redacted]w[redacted]d was admitted to the hospital 07/09/2015 for scheduled cesarean section with the following indication:Elective Repeat. And BTL with on-Q pump Membrane Rupture Time/Date:   ,    Patient delivered a Viable infant.07/09/2015  Details of operation can be found in separate operative note.  Pateint had an uncomplicated postpartum course.  She is ambulating, tolerating a regular diet, passing flatus, and urinating well. Patient is discharged home in stable condition on  07/12/2015          Physical exam  Filed Vitals:   07/11/15 2015 07/12/15 0000 07/12/15 0352 07/12/15 0745  BP: 143/83   146/90  Pulse: 90   93  Temp: 98.7 F (37.1 C) 98.1 F (36.7 C) 98.7 F (37.1 C) 98.2 F (36.8 C)  TempSrc: Oral  Oral Oral  Resp: 18   18  Height:      Weight:      SpO2:    99%   General: alert, cooperative and no distress Lochia: appropriate Uterine Fundus: firm Incision: Healing well with no significant drainage DVT Evaluation: No evidence of DVT seen on physical exam. Labs: Lab Results  Component Value Date   WBC 9.4 07/10/2015   HGB 9.3* 07/10/2015   HCT 28.0* 07/10/2015   MCV 77.5* 07/10/2015   PLT 231 07/10/2015   CMP Latest Ref Rng  07/09/2015  Glucose 65 - 99 mg/dL 67  BUN 6 - 20 mg/dL <5(L)  Creatinine 0.44 - 1.00 mg/dL 0.59  Sodium 135 - 145 mmol/L 137  Potassium 3.5 - 5.1 mmol/L 3.5  Chloride 101 - 111 mmol/L 105  CO2 22 - 32 mmol/L 21(L)  Calcium 8.9 - 10.3 mg/dL 9.4  Total Protein 6.5 - 8.1 g/dL 7.0  Total Bilirubin 0.3 - 1.2 mg/dL 1.2  Alkaline Phos 38 - 126 U/L 307(H)  AST 15 - 41 U/L 17  ALT 14 - 54 U/L 8(L)    Discharge instruction: per After Visit Summary and "Baby and Me Booklet".  After visit meds:    Medication List    ASK your doctor about these medications        aspirin 81 MG chewable tablet  Chew 81 mg by mouth daily.     ferrous sulfate 325 (65 FE) MG tablet  Take 325 mg by mouth 2 (two) times  daily with a meal.     potassium chloride SA 20 MEQ tablet  Commonly known as:  K-DUR,KLOR-CON  Take 1 tablet (20 mEq total) by mouth 2 (two) times daily.     prenatal multivitamin Tabs tablet  Take 1 tablet by mouth daily at 12 noon.        Diet: routine diet  Activity: Advance as tolerated. Pelvic rest for 6 weeks.   Outpatient follow up:2 weeks Follow up Appt:No future appointments. Follow up Visit:No Follow-up on file.  Postpartum contraception: Tubal Ligation  Newborn Data: Live born female  Birth Weight: 9 lb 11.2 oz (4400 g) APGAR: 8, 9  Baby Feeding: Bottle and Breast Disposition:home with mother   07/12/2015 Lorette Ang, MD

## 2015-07-14 ENCOUNTER — Encounter: Admission: RE | Payer: Self-pay | Source: Ambulatory Visit

## 2015-07-14 ENCOUNTER — Inpatient Hospital Stay
Admission: RE | Admit: 2015-07-14 | Payer: Managed Care, Other (non HMO) | Source: Ambulatory Visit | Admitting: Obstetrics and Gynecology

## 2015-07-14 SURGERY — Surgical Case
Anesthesia: Spinal | Laterality: Bilateral

## 2015-08-26 ENCOUNTER — Other Ambulatory Visit: Payer: Self-pay | Admitting: Obstetrics and Gynecology

## 2015-08-26 DIAGNOSIS — N361 Urethral diverticulum: Secondary | ICD-10-CM

## 2015-09-05 ENCOUNTER — Ambulatory Visit
Admission: RE | Admit: 2015-09-05 | Discharge: 2015-09-05 | Disposition: A | Discharge status: Home / Self Care | Payer: Managed Care, Other (non HMO) | Source: Ambulatory Visit | Attending: Obstetrics and Gynecology | Admitting: Obstetrics and Gynecology

## 2015-09-05 DIAGNOSIS — N361 Urethral diverticulum: Secondary | ICD-10-CM | POA: Diagnosis not present

## 2015-09-05 DIAGNOSIS — N898 Other specified noninflammatory disorders of vagina: Secondary | ICD-10-CM | POA: Diagnosis not present

## 2015-09-05 MED ORDER — GADOBENATE DIMEGLUMINE 529 MG/ML IV SOLN
15.0000 mL | Freq: Once | INTRAVENOUS | Status: AC | PRN
  Administered 2015-09-05: 15 mL via INTRAVENOUS

## 2015-09-11 NOTE — H&P (Signed)
Ms. Cornish is a 40 y.o. female here for an excision of an anterior vaginal wall cyst MRI results . does not show a communication between the urethra and the cyst ie.. Does not appear to be a urethral diverticulum    Past Medical History:  has a past medical history of Chicken pox.  Past Surgical History:  has a past surgical history that includes Cesarean section (2017) and Tubal ligation (06/2015). Family History: family history includes Diabetes type II in her mother; Heart disease in her maternal grandmother. Social History:  reports that she has never smoked. She has never used smokeless tobacco. She reports that she does not drink alcohol or use illicit drugs. OB/GYN History:  OB History    Gravida Para Term Preterm AB Living   5 3 2 1 2 3    SAB TAB Ectopic Multiple Live Births   2    3      Allergies: is allergic to no known drug allergies. Medications: No current outpatient prescriptions on file.  Review of Systems: General:                                          No fatigue or weight loss Eyes:                                                         No vision changes Ears:                                                          No hearing difficulty Respiratory:                No cough or shortness of breath Pulmonary:                                      No asthma or shortness of breath Cardiovascular:                     No chest pain, palpitations, dyspnea on exertion Gastrointestinal:                    No abdominal bloating, chronic diarrhea, constipations, masses, pain or hematochezia Genitourinary:                                 No hematuria, dysuria, abnormal vaginal discharge, pelvic pain, Menometrorrhagia Lymphatic:                                       No swollen lymph nodes Musculoskeletal:                   No muscle weakness Neurologic:  No extremity weakness, syncope, seizure disorder Psychiatric:                                       No history of depression, delusions or suicidal/homicidal ideation    Exam:      Vitals:   09/11/15 1354  BP: 124/87  Pulse: 76    Body mass index is 31.95 kg/(m^2).  WDWN black female in NAD   Lungs: CTA  CV : RRR without murmur    Pelvic: tanner stage 5 ,  External genitalia: vulva /labia no lesions Urethra: no prolapse Vagina: normal physiologic d/c, anterior 2x2 cm cyst  Cervix: no lesions, no cervical motion tenderness   Uterus: normal size shape and contour, non-tender Adnexa: no mass,  non-tender     Impression:   The encounter diagnosis was Vaginal wall cyst.    Plan:   Offered expectant management vs surgical excision . Pt opts for the latter       Caroline Sauger, MD       Electronically signed by Caroline Sauger, MD at 09/11/2015 2:14 PM      Office Visit on 09/11/2015      Department  Name Address Phone Fax  [facility] [place] [address] [phone]  Service Location  Name Address      [address] [place] [address]

## 2015-09-15 ENCOUNTER — Encounter: Admission: RE | Payer: Self-pay | Source: Ambulatory Visit

## 2015-09-15 ENCOUNTER — Ambulatory Visit
Admission: RE | Admit: 2015-09-15 | Payer: Managed Care, Other (non HMO) | Source: Ambulatory Visit | Admitting: Obstetrics and Gynecology

## 2015-09-15 SURGERY — EXCISION, CYST, VAGINA
Anesthesia: Choice

## 2015-09-15 MED ORDER — VASOPRESSIN 20 UNIT/ML IV SOLN
INTRAVENOUS | Status: AC
  Filled 2015-09-15: qty 1

## 2015-09-15 MED ORDER — CEFOXITIN SODIUM-DEXTROSE 2-2.2 GM-% IV SOLR (PREMIX): 2.0000 g | INTRAVENOUS | Status: AC

## 2015-09-15 MED ORDER — SODIUM CHLORIDE 0.9 % IJ SOLN
INTRAMUSCULAR | Status: AC
  Filled 2015-09-15: qty 50

## 2015-09-15 MED ORDER — SILVER NITRATE-POT NITRATE 75-25 % EX MISC
CUTANEOUS | Status: AC
  Filled 2015-09-15: qty 4

## 2015-09-15 MED ORDER — CEFOXITIN SODIUM-DEXTROSE 2-2.2 GM-% IV SOLR (PREMIX)
INTRAVENOUS | Status: AC
  Filled 2015-09-15: qty 50

## 2015-09-15 MED ORDER — LIDOCAINE HCL (PF) 1 % IJ SOLN
INTRAMUSCULAR | Status: AC
  Filled 2015-09-15: qty 2

## 2015-09-24 NOTE — H&P (Signed)
TaylorBattsis a 40 y.o.femalehere for an excision of an anterior vaginal wall cyst MRI results . does not show a communication between the urethra and the cyst ie.. Does not appear to be a urethral diverticulum    Past Medical History:has a past medical history of Chicken pox. Past Surgical History:has a past surgical history that includes Cesarean section (2017) and Tubal ligation (06/2015). Family History:family history includes Diabetes type II in her mother; Heart disease in her maternal grandmother. Social History:reports that she has never smoked. She has never used smokeless tobacco. She reports that she does not drink alcohol or use illicit drugs. OB/GYN History:         OB History   Gravida Para Term Preterm AB Living   5 3 2 1 2 3    SAB TAB Ectopic Multiple Live Births   2    3      Allergies:is allergic to no known drug allergies. Medications: No current outpatient prescriptions on file.  Review of Systems: General: No fatigue or weightloss Eyes:No vision changes Ears:No hearing difficulty Respiratory:No cough or shortness of breath Pulmonary: No asthma or shortness of breath Cardiovascular:No chest pain, palpitations, dyspnea on exertion Gastrointestinal:No abdominal bloating, chronic diarrhea, constipations, masses, pain or hematochezia Genitourinary:No hematuria, dysuria, abnormal vaginal discharge, pelvic pain, Menometrorrhagia Lymphatic:No swollen lymph nodes Musculoskeletal:No muscle weakness Neurologic:No extremity weakness, syncope, seizure  disorder Psychiatric:No history of depression, delusions or suicidal/homicidal ideation   Exam:      Vitals:   09/11/15 1354  BP: 124/87  Pulse: 76    Body mass index is 31.95 kg/(m^2).  WDWN blackfemale in NAD  Lungs: CTA  CV: RRR without murmur   Pelvic: tanner stage 5 ,  External genitalia: vulva /labia no lesions Urethra: no prolapse Vagina: normal physiologic d/c, anterior 2x2 cm cyst  Cervix: no lesions, no cervical motion tenderness  Uterus: normal size shape and contour, non-tender Adnexa:no mass, non-tender    Impression:   The encounter diagnosis was Vaginal wall cyst.    Plan:   Offered expectant management vs surgical excision . Pt opts for the latter       Caroline Sauger, MD

## 2015-10-14 ENCOUNTER — Other Ambulatory Visit: Payer: Self-pay

## 2015-10-16 ENCOUNTER — Encounter
Admission: RE | Admit: 2015-10-16 | Discharge: 2015-10-16 | Disposition: A | Discharge status: Home / Self Care | Payer: Managed Care, Other (non HMO) | Source: Ambulatory Visit | Attending: Obstetrics and Gynecology | Admitting: Obstetrics and Gynecology

## 2015-10-16 ENCOUNTER — Encounter: Payer: Self-pay | Admitting: *Deleted

## 2015-10-16 DIAGNOSIS — Z01818 Encounter for other preprocedural examination: Secondary | ICD-10-CM | POA: Insufficient documentation

## 2015-10-16 HISTORY — DX: Anemia, unspecified: D64.9

## 2015-10-16 LAB — BASIC METABOLIC PANEL
Anion gap: 6 (ref 5–15)
BUN: 9 mg/dL (ref 6–20)
CALCIUM: 9.4 mg/dL (ref 8.9–10.3)
CHLORIDE: 104 mmol/L (ref 101–111)
CO2: 28 mmol/L (ref 22–32)
CREATININE: 0.86 mg/dL (ref 0.44–1.00)
GFR calc Af Amer: >60 mL/min (ref >60)
GFR calc non Af Amer: >60 mL/min (ref >60)
Glucose, Bld: 90 mg/dL (ref 65–99)
Potassium: 3.6 mmol/L (ref 3.5–5.1)
SODIUM: 138 mmol/L (ref 135–145)

## 2015-10-16 LAB — TYPE AND SCREEN
ABO/RH(D): A POS
Antibody Screen: NEGATIVE
Extend sample reason: UNDETERMINED

## 2015-10-16 LAB — CBC
HCT: 35.2 % (ref 35.0–47.0)
Hemoglobin: 12 g/dL (ref 12.0–16.0)
MCH: 27.3 pg (ref 26.0–34.0)
MCHC: 34 g/dL (ref 32.0–36.0)
MCV: 80.2 fL (ref 80.0–100.0)
PLATELETS: 238 10*3/uL (ref 150–440)
RBC: 4.39 MIL/uL (ref 3.80–5.20)
RDW: 21.2 % — AB (ref 11.5–14.5)
WBC: 4.8 10*3/uL (ref 3.6–11.0)

## 2015-10-16 NOTE — Patient Instructions (Signed)
  Your procedure is scheduled [id number] 6, 2017 (Friday) Report to Same Day Surgery 2nd floor Medical Mall To find out your arrival time please call [phone] between 1PM - 3PM on October 23, 2015 (Thursday)  Remember: Instructions that are not followed completely may result in serious medical risk, up to and including death, or upon the discretion of your surgeon and anesthesiologist your surgery may need to be rescheduled.    _x___ 1. Do not eat food or drink liquids after midnight. No gum chewing or hard candies.     _x___ 2. No Alcohol for 24 hours before or after surgery.   _x    __3. No Smoking for 24 prior to surgery.   ____  4. Bring all medications with you on the day of surgery if instructed.    __x__ 5. Notify your doctor if there is any change in your medical condition     (cold, fever, infections).     Do not wear jewelry, make-up, hairpins, clips or nail polish.  Do not wear lotions, powders, or perfumes. You may wear deodorant.  Do not shave 48 hours prior to surgery. Men may shave face and neck.  Do not bring valuables to the hospital.    [facility] is not responsible for any belongings or valuables.               Contacts, dentures or bridgework may not be worn into surgery.  Leave your suitcase in the car. After surgery it may be brought to your room.  For patients admitted to the hospital, discharge time is determined by your treatment team.   Patients discharged the day of surgery will not be allowed to drive home.    Please read over the following fact sheets that you were given:   [facility] Preparing for Surgery and or MRSA Information   ___ Take these medicines the morning of surgery with A SIP OF WATER:    1.   2.  3.  4.  5.  6.  ____Fleets enema or Magnesium Citrate as directed.   _x___ Use CHG Soap or sage wipes as directed on instruction sheet   ____ Use inhalers on the day of surgery and bring to hospital day of surgery  ____ Stop  metformin 2 days prior to surgery    ____ Take 1/2 of usual insulin dose the night before surgery and none on the morning of  surgery.          __x__ Stop aspirin or coumadin, or plavix (NO ASPIRIN)  x__ Stop Anti-inflammatories such as Advil, Aleve, Ibuprofen, Motrin, Naproxen,          Naprosyn, Goodies powders or aspirin products. Ok to take Tylenol.   ____ Stop supplements until after surgery.    ____ Bring C-Pap to the hospital.

## 2015-10-20 ENCOUNTER — Other Ambulatory Visit: Payer: Self-pay

## 2015-10-23 MED ORDER — CEFOXITIN SODIUM-DEXTROSE 2-2.2 GM-% IV SOLR (PREMIX)
2.0000 g | INTRAVENOUS | Status: AC
  Administered 2015-10-24: 2000 mg via INTRAVENOUS

## 2015-10-24 ENCOUNTER — Ambulatory Visit: Payer: Managed Care, Other (non HMO) | Admitting: Anesthesiology

## 2015-10-24 ENCOUNTER — Encounter: Payer: Self-pay | Admitting: *Deleted

## 2015-10-24 ENCOUNTER — Encounter
Admission: RE | Disposition: A | Discharge status: Home / Self Care | Payer: Self-pay | Source: Ambulatory Visit | Attending: Obstetrics and Gynecology

## 2015-10-24 ENCOUNTER — Ambulatory Visit
Admission: RE | Admit: 2015-10-24 | Discharge: 2015-10-24 | Disposition: A | Discharge status: Home / Self Care | Payer: Managed Care, Other (non HMO) | Source: Ambulatory Visit | Attending: Obstetrics and Gynecology | Admitting: Obstetrics and Gynecology

## 2015-10-24 DIAGNOSIS — Z6831 Body mass index (BMI) 31.0-31.9, adult: Secondary | ICD-10-CM | POA: Diagnosis not present

## 2015-10-24 DIAGNOSIS — N898 Other specified noninflammatory disorders of vagina: Secondary | ICD-10-CM | POA: Diagnosis present

## 2015-10-24 DIAGNOSIS — N368 Other specified disorders of urethra: Secondary | ICD-10-CM | POA: Diagnosis not present

## 2015-10-24 DIAGNOSIS — E669 Obesity, unspecified: Secondary | ICD-10-CM | POA: Diagnosis not present

## 2015-10-24 HISTORY — PX: EXCISION VAGINAL CYST: SHX5825

## 2015-10-24 LAB — POCT PREGNANCY, URINE: PREG TEST UR: NEGATIVE

## 2015-10-24 LAB — TYPE AND SCREEN
ABO/RH(D): A POS
ANTIBODY SCREEN: NEGATIVE

## 2015-10-24 SURGERY — EXCISION, CYST, VAGINA
Anesthesia: General

## 2015-10-24 MED ORDER — MEPERIDINE HCL 25 MG/ML IJ SOLN: 6.2500 mg | INTRAMUSCULAR | Status: DC | PRN

## 2015-10-24 MED ORDER — ONDANSETRON HCL 4 MG/2ML IJ SOLN
INTRAMUSCULAR | Status: DC | PRN
  Administered 2015-10-24: 4 mg via INTRAVENOUS

## 2015-10-24 MED ORDER — OXYCODONE HCL 5 MG PO TABS: 5.0000 mg | ORAL_TABLET | Freq: Once | ORAL | Status: DC | PRN

## 2015-10-24 MED ORDER — DEXAMETHASONE SODIUM PHOSPHATE 10 MG/ML IJ SOLN
INTRAMUSCULAR | Status: DC | PRN
  Administered 2015-10-24: 10 mg via INTRAVENOUS

## 2015-10-24 MED ORDER — MIDAZOLAM HCL 5 MG/5ML IJ SOLN
INTRAMUSCULAR | Status: DC | PRN
  Administered 2015-10-24: 2 mg via INTRAVENOUS

## 2015-10-24 MED ORDER — FENTANYL CITRATE (PF) 100 MCG/2ML IJ SOLN
INTRAMUSCULAR | Status: DC | PRN
  Administered 2015-10-24: 50 ug via INTRAVENOUS
  Administered 2015-10-24 (×2): 25 ug via INTRAVENOUS
  Administered 2015-10-24: 50 ug via INTRAVENOUS

## 2015-10-24 MED ORDER — DEXMEDETOMIDINE HCL 200 MCG/2ML IV SOLN
INTRAVENOUS | Status: DC | PRN
  Administered 2015-10-24: 8 ug via INTRAVENOUS

## 2015-10-24 MED ORDER — SODIUM CHLORIDE 0.9 % IJ SOLN
INTRAMUSCULAR | Status: AC
  Filled 2015-10-24: qty 50

## 2015-10-24 MED ORDER — FAMOTIDINE 20 MG PO TABS
ORAL_TABLET | ORAL | Status: AC
  Filled 2015-10-24: qty 1

## 2015-10-24 MED ORDER — VASOPRESSIN 20 UNIT/ML IV SOLN
INTRAVENOUS | Status: AC
  Filled 2015-10-24: qty 1

## 2015-10-24 MED ORDER — PHENYLEPHRINE HCL 10 MG/ML IJ SOLN
INTRAMUSCULAR | Status: DC | PRN
  Administered 2015-10-24 (×4): 100 ug via INTRAVENOUS

## 2015-10-24 MED ORDER — SILVER NITRATE-POT NITRATE 75-25 % EX MISC
CUTANEOUS | Status: AC
  Filled 2015-10-24: qty 4

## 2015-10-24 MED ORDER — LIDOCAINE-EPINEPHRINE 1 %-1:100000 IJ SOLN
INTRAMUSCULAR | Status: DC | PRN
  Administered 2015-10-24: 4 mL

## 2015-10-24 MED ORDER — CEFOXITIN SODIUM-DEXTROSE 2-2.2 GM-% IV SOLR (PREMIX)
INTRAVENOUS | Status: AC
  Filled 2015-10-24: qty 50

## 2015-10-24 MED ORDER — OXYCODONE HCL 5 MG/5ML PO SOLN: 5.0000 mg | Freq: Once | ORAL | Status: DC | PRN

## 2015-10-24 MED ORDER — LIDOCAINE 2% (20 MG/ML) 5 ML SYRINGE
INTRAMUSCULAR | Status: DC | PRN
  Administered 2015-10-24: 100 mg via INTRAVENOUS

## 2015-10-24 MED ORDER — GLYCOPYRROLATE 0.2 MG/ML IJ SOLN
INTRAMUSCULAR | Status: DC | PRN
  Administered 2015-10-24: 0.2 mg via INTRAVENOUS

## 2015-10-24 MED ORDER — METHYLENE BLUE 0.5 % INJ SOLN
INTRAVENOUS | Status: DC | PRN
  Administered 2015-10-24: 10 mL via INTRAVESICAL

## 2015-10-24 MED ORDER — PROMETHAZINE HCL 25 MG/ML IJ SOLN: 6.2500 mg | INTRAMUSCULAR | Status: DC | PRN

## 2015-10-24 MED ORDER — PROPOFOL 10 MG/ML IV BOLUS
INTRAVENOUS | Status: DC | PRN
  Administered 2015-10-24: 200 mg via INTRAVENOUS
  Administered 2015-10-24: 100 mg via INTRAVENOUS

## 2015-10-24 MED ORDER — FENTANYL CITRATE (PF) 100 MCG/2ML IJ SOLN: 25.0000 ug | INTRAMUSCULAR | Status: DC | PRN

## 2015-10-24 MED ORDER — LIDOCAINE-EPINEPHRINE 1 %-1:100000 IJ SOLN
INTRAMUSCULAR | Status: AC
  Filled 2015-10-24: qty 1

## 2015-10-24 MED ORDER — LACTATED RINGERS IV SOLN
INTRAVENOUS | Status: DC
  Administered 2015-10-24 (×2): via INTRAVENOUS

## 2015-10-24 MED ORDER — FAMOTIDINE 20 MG PO TABS
20.0000 mg | ORAL_TABLET | Freq: Once | ORAL | Status: AC
  Administered 2015-10-24: 20 mg via ORAL

## 2015-10-24 SURGICAL SUPPLY — 30 items
BAG URINE LEG 25OZ (MISCELLANEOUS) ×6 IMPLANT
BAG URO DRAIN 2000ML W/SPOUT (MISCELLANEOUS) ×3 IMPLANT
BLADE SURG 15 STRL LF DISP TIS (BLADE) ×1 IMPLANT
BLADE SURG 15 STRL SS (BLADE) ×2
BLADE SURG SZ10 CARB STEEL (BLADE) ×3 IMPLANT
CATH FOL 2WAY LX 16X5 (CATHETERS) ×3 IMPLANT
CATH ROBINSON RED A/P 16FR (CATHETERS) ×3 IMPLANT
COVER LIGHT HANDLE STERIS (MISCELLANEOUS) ×3 IMPLANT
DRAPE PERI LITHO V/GYN (MISCELLANEOUS) ×3 IMPLANT
DRAPE UNDER BUTTOCK W/FLU (DRAPES) ×3 IMPLANT
ELECT REM PT RETURN 9FT ADLT (ELECTROSURGICAL) ×3
ELECTRODE REM PT RTRN 9FT ADLT (ELECTROSURGICAL) ×1 IMPLANT
GLOVE BIO SURGEON STRL SZ8 (GLOVE) ×12 IMPLANT
GOWN STRL REUS W/ TWL LRG LVL3 (GOWN DISPOSABLE) ×1 IMPLANT
GOWN STRL REUS W/ TWL XL LVL3 (GOWN DISPOSABLE) ×1 IMPLANT
GOWN STRL REUS W/TWL LRG LVL3 (GOWN DISPOSABLE) ×2
GOWN STRL REUS W/TWL XL LVL3 (GOWN DISPOSABLE) ×2
KIT RM TURNOVER CYSTO AR (KITS) ×3 IMPLANT
LABEL OR SOLS (LABEL) ×3 IMPLANT
NDL SAFETY 22GX1.5 (NEEDLE) ×3 IMPLANT
NS IRRIG 500ML POUR BTL (IV SOLUTION) ×3 IMPLANT
PACK BASIN MINOR ARMC (MISCELLANEOUS) ×3 IMPLANT
PAD OB MATERNITY 4.3X12.25 (PERSONAL CARE ITEMS) ×3 IMPLANT
PAD PREP 24X41 OB/GYN DISP (PERSONAL CARE ITEMS) ×3 IMPLANT
SPONGE XRAY 4X4 16PLY STRL (MISCELLANEOUS) IMPLANT
SUT VIC AB 3-0 SH 27 (SUTURE) ×4
SUT VIC AB 3-0 SH 27X BRD (SUTURE) ×2 IMPLANT
SUT VIC AB 4-0 FS2 27 (SUTURE) ×6 IMPLANT
SUT VICRYL AB 3-0 FS1 BRD 27IN (SUTURE) ×3 IMPLANT
SYR CONTROL 10ML (SYRINGE) ×3 IMPLANT

## 2015-10-24 NOTE — Anesthesia Procedure Notes (Signed)
Procedure Name: LMA Insertion Date/Time: 10/24/2015 7:33 AM Performed by: Marsh Dolly Pre-anesthesia Checklist: Patient identified, Patient being monitored, Timeout performed, Emergency Drugs available and Suction available Patient Re-evaluated:Patient Re-evaluated prior to inductionOxygen Delivery Method: Circle system utilized Preoxygenation: Pre-oxygenation with 100% oxygen Intubation Type: IV induction Ventilation: Mask ventilation without difficulty LMA: LMA inserted LMA Size: 3.5 Tube type: Oral Number of attempts: 1 Placement Confirmation: positive ETCO2 and breath sounds checked- equal and bilateral Tube secured with: Tape Dental Injury: Teeth and Oropharynx as per pre-operative assessment

## 2015-10-24 NOTE — Anesthesia Postprocedure Evaluation (Signed)
Anesthesia Post Note  Patient: Science writer  Procedure(s) Performed: Procedure(s) (LRB): EXCISION VAGINAL CYST (N/A)  Patient location during evaluation: PACU Anesthesia Type: General Level of consciousness: awake and alert and oriented Pain management: pain level controlled Vital Signs Assessment: post-procedure vital signs reviewed and stable Respiratory status: spontaneous breathing, nonlabored ventilation and respiratory function stable Cardiovascular status: blood pressure returned to baseline and stable Postop Assessment: no signs of nausea or vomiting Anesthetic complications: no    Last Vitals:  Vitals:   10/24/15 1030 10/24/15 1040  BP:  113/62  Pulse: 74 67  Resp: 20 (!) 1  Temp: 36.2 C (!) 35.9 C    Last Pain:  Vitals:   10/24/15 1040  TempSrc: Temporal                 Lennyn Gange

## 2015-10-24 NOTE — Progress Notes (Signed)
Pt ready for excision of vaginal cyst excision . NPO. Neg hcg . All questions answered

## 2015-10-24 NOTE — Op Note (Signed)
Date of procedure: 10/24/15  Preoperative diagnosis:  1. Infected periurethral cyst   Postoperative diagnosis:  1. 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 Urethral diverticulectomy 2. Periurethral retrograde urethrogram  Surgeon: Hollice Espy, MD  Anesthesia: General  Complications: None  Intraoperative findings: Approximately 2 cm suspected urethral diverticulum containing copious purulent caseous material, intraoperative consultation  EBL: minimal   Specimens: urethral diverticulum  Drains: 16 Fr Foley  Indication: Taylor Shannon is a 40 y.o. patient with an anterior vaginal wall cyst suspected to be a periurethral cyst. I was called intraoperatively to assist Dr. Ouida Sills due to its close proximity to the urethra and concern for urethral diverticulum.  Description of procedure:  Upon my arrival in the operating room, the patient was already in the lithotomy position with an anterior vaginal wall incision was partially dissected approximate 2 cm vaginal cyst which was in close proximity to the urethra which was just posterior to this mass. Upon manipulation of the cyst, there was some brown caseous purulent material which did express from the urethra concern for communication with the urethra itself.    MRI imaging was carefully reviewed. The cyst in question was appreciated on MRI which did complete obtained complex material and no clear communication was seen between the urethra.  At this point time, I helped assist with dissection of the cyst. I did violate the capsule of the cyst in a few areas at which time the same quality of material was seen.  The cyst was in very close proximity to the urethra itself and care was taken not to violate this structure.  The Foley catheter was palpable within the urethra.  I was ultimately successful in creating plane of the entire way around the cyst and there was a would appeared to be stalk on the right lateral  portion of the cyst which extended towards the anterior lateral urethra.  I dissected this towards the base of the stalk towards the urethra. Ultimately, what was thought to be the stalk was amputated and the cyst and stalk structure was carefully inspected with lacrimal duct probes although no clear communicating channel was identified. This is passed off the field. There was no clear violation of the urethra or obvious communicating tract. At this point in time, using diluted methylene blue solution, an 33 [place] Angiocath was used to inject contrast material alongside of the catheter. This created adequate distention of the urethra itself but no efflux of blue material was seen anywhere along the posterior aspect or lateral portions of the urethra. At the site of the suspected urethral diverticulum tract, I placed clean white gauze along the latera portion of the urethra to assess for occult extravasation which was not appreciated.  The periurethral tissue was then brought together using a 4-0 Vicryl to create an addition layer closure in a running fashion. Dr. Romelle Starcher and then closed the vaginal wall defect to complete the procedure.  I recommended that the Foley catheter remain in place until Monday.  Hollice Espy, M.D.

## 2015-10-24 NOTE — Transfer of Care (Signed)
Immediate Anesthesia Transfer of Care Note  Patient: Taylor Shannon  Procedure(s) Performed: Procedure(s): EXCISION VAGINAL CYST (N/A)  Patient Location: PACU  Anesthesia Type:General  Level of Consciousness: awake  Airway & Oxygen Therapy: Patient Spontanous Breathing and Patient connected to face mask oxygen  Post-op Assessment: Report given to RN and Post -op Vital signs reviewed and stable  Post vital signs: Reviewed and stable  Last Vitals:  Vitals:   10/24/15 0617  BP: 130/89  Pulse: 68  Resp: 16  Temp: 36.6 C    Last Pain:  Vitals:   10/24/15 0617  TempSrc: Oral         Complications: No apparent anesthesia complications

## 2015-10-24 NOTE — Progress Notes (Signed)
Catheter emptied of 50cc golden urine   No blood noted

## 2015-10-24 NOTE — Progress Notes (Signed)
Catheter care gone over with pt and family   States understanding with no questions

## 2015-10-24 NOTE — Brief Op Note (Signed)
10/24/2015  9:32 AM  PATIENT:  Taylor Shannon  40 y.o. 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  PROCEDURE:  Excision of suburethral diverticulum  SURGEON:  Surgeon(s) and Role:    Boykin Nearing, MD - Primary    * Hollice Espy, MD -consulting physician ,urologist  PHYSICIAN ASSISTANT:   ASSISTANTS: none   ANESTHESIA:  GETA EBL:  Total I/O In: 800 [I.V.:800] Out: 350 [Urine:300; Blood:50]  BLOOD ADMINISTERED:none  DRAINS: Urinary Catheter (Foley)   LOCAL MEDICATIONS USED:  LIDOCAINE  and Amount: 3 ml  SPECIMEN:  Source of Specimen:  urethral diverticulum  DISPOSITION OF SPECIMEN:  PATHOLOGY  COUNTS:  YES  TOURNIQUET:  * No tourniquets in log *  DICTATION: .Other Dictation: Dictation Number verbal  PLAN OF CARE: Discharge to home after PACU  PATIENT DISPOSITION:  PACU - hemodynamically stable.   Delay start of Pharmacological VTE agent (>24hrs) due to surgical blood loss or risk of bleeding: not applicable

## 2015-10-24 NOTE — Anesthesia Preprocedure Evaluation (Signed)
Anesthesia Evaluation  Patient identified by MRN, date of birth, ID band Patient awake    Reviewed: Allergy & Precautions, NPO status , Patient's Chart, lab work & pertinent test results  History of Anesthesia Complications Negative for: history of anesthetic complications  Airway Mallampati: III  TM Distance: >3 FB Neck ROM: Full    Dental  (+) Poor Dentition, Loose,    Pulmonary neg pulmonary ROS, neg sleep apnea, neg COPD,    breath sounds clear to auscultation- rhonchi (-) wheezing      Cardiovascular Exercise Tolerance: Good (-) hypertension(-) CAD and (-) Past MI  Rhythm:Regular Rate:Normal - Systolic murmurs and - Diastolic murmurs    Neuro/Psych negative neurological ROS  negative psych ROS   GI/Hepatic negative GI ROS, Neg liver ROS,   Endo/Other  neg diabetes  Renal/GU negative Renal ROS     Musculoskeletal negative musculoskeletal ROS (+)   Abdominal (+) + obese,   Peds  Hematology  (+) anemia ,   Anesthesia Other Findings Past Medical History: No date: Anemia March 2017: Gallstones 2017: Gestational diabetes 2002: Pregnancy induced hypertension   Reproductive/Obstetrics                             Anesthesia Physical Anesthesia Plan  ASA: II  Anesthesia Plan: General   Post-op Pain Management:    Induction: Intravenous  Airway Management Planned: LMA  Additional Equipment:   Intra-op Plan:   Post-operative Plan:   Informed Consent: I have reviewed the patients History and Physical, chart, labs and discussed the procedure including the risks, benefits and alternatives for the proposed anesthesia with the patient or authorized representative who has indicated his/her understanding and acceptance.   Dental advisory given  Plan Discussed with: CRNA and Anesthesiologist  Anesthesia Plan Comments:         Anesthesia Quick Evaluation

## 2015-10-24 NOTE — Discharge Instructions (Signed)
AMBULATORY SURGERY  °DISCHARGE INSTRUCTIONS ° ° °1) The drugs that you were given will stay in your system until tomorrow so for the next 24 hours you should not: ° °A) Drive an automobile °B) Make any legal decisions °C) Drink any alcoholic beverage ° ° °2) You may resume regular meals tomorrow.  Today it is better to start with liquids and gradually work up to solid foods. ° °You may eat anything you prefer, but it is better to start with liquids, then soup and crackers, and gradually work up to solid foods. ° ° °3) Please notify your doctor immediately if you have any unusual bleeding, trouble breathing, redness and pain at the surgery site, drainage, fever, or pain not relieved by medication. ° ° ° °4) Additional Instructions: ° ° ° ° ° ° ° °Please contact your physician with any problems or Same Day Surgery at [phone], Monday through Friday 6 am to 4 pm, or [place] [facility] [place] Main number at [phone].AMBULATORY SURGERY  °DISCHARGE INSTRUCTIONS ° ° °5) The drugs that you were given will stay in your system until tomorrow so for the next 24 hours you should not: ° °D) Drive an automobile °E) Make any legal decisions °F) Drink any alcoholic beverage ° ° °6) You may resume regular meals tomorrow.  Today it is better to start with liquids and gradually work up to solid foods. ° °You may eat anything you prefer, but it is better to start with liquids, then soup and crackers, and gradually work up to solid foods. ° ° °7) Please notify your doctor immediately if you have any unusual bleeding, trouble breathing, redness and pain at the surgery site, drainage, fever, or pain not relieved by medication. ° ° ° °8) Additional Instructions: ° ° ° ° ° ° ° °Please contact your physician with any problems or Same Day Surgery at [phone], Monday through Friday 6 am to 4 pm, or [place] [facility] [place] Main number at [phone]. °

## 2015-10-25 NOTE — Op Note (Signed)
Taylor Shannon, Taylor Shannon              ACCOUNT NO.:  [id number]  MEDICAL RECORD NO.:  [id number]  LOCATION:  PAT                          FACILITY:  [facility]  PHYSICIAN:  Laverta Baltimore, MDDATE OF BIRTH:  03-18-1975  DATE OF PROCEDURE:  10/24/2015 DATE OF DISCHARGE:  10/16/2015                              OPERATIVE REPORT   PREOPERATIVE DIAGNOSIS:  Anterior vaginal wall cyst.  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  PROCEDURES: 1. Urethral diverticulectomy. 2. Periurethral retrograde urethrogram.  CONSULTING PHYSICIAN:  Hollice Espy, MD.  ANESTHESIA:  General endotracheal anesthesia.  INDICATION:  A 40 year old, gravida 5, para 3 patient approximately 3 months status post uncomplicated vaginal delivery was noted to have a 2 cm anterior vaginal wall cyst that was noted on her postpartum examination.  The patient underwent a pelvic MRI with contrast that failed to demonstrate this cyst to be a urethral diverticulum.  DESCRIPTION OF PROCEDURE:  After adequate general endotracheal anesthesia, the patient was placed in dorsal supine position legs in the candy-cane stirrups.  A time-out was performed.  The patient did receive 2 g IV cefoxitin prior to commencement of the case.  The vagina was prepped with Betadine.  A Foley catheter was placed to give guidance to the course of the urethra during the vaginal wall cyst dissection.  The epithelium was injected with 1% lidocaine with epinephrine and a 2 cm incision was made in the anterior vaginal wall.  Meticulous dissection of the cyst ensued and ultimately the posterior cyst wall appeared to be extremely close to the urethra.  Dissection was stopped.  Intra-op consultation with urologist, Dr. Hollice Espy followed.  Dr. Erlene Quan scrubbed and then aided in the additional dissection and ultimate removal of the cyst, thinking that this was most likely an infected urethral diverticulum without a large  communication between the uretra and the cyst.  There was old infected purulent material that was extruded from the cyst during the procedure.  During the dissection, Dr. Erlene Quan retrograde filled the urethra with methylene blue and saline, and there did not seem to be a communication or efflux from the urethra into the cyst.  After the cyst was removed, a repeat retrograde filling of the urethra failed to demonstrate definitive leakage into the periurethral space.  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  Please reference Dr. Cherrie Gauze operation report.  Dr. Erlene Quan did oversew some tissue to reinforce the suburethral area.  I then copiously irrigated the subvaginal cyst wall area and then closed the vaginal epithelium with a running 3-0 Vicryl suture.  There were no complications.  ESTIMATED BLOOD LOSS:  50 mL.  INTRAOPERATIVE FLUIDS:  1000 mL.  The patient did receive 2 g IV cefoxitin prior to commencement of the case.  The patient was taken to recovery room in good condition.          ______________________________ Laverta Baltimore, MD     TS/MEDQ  D:  10/24/2015  T:  10/25/2015  Job:  [id number]

## 2015-10-27 ENCOUNTER — Encounter: Payer: Self-pay | Admitting: Obstetrics and Gynecology

## 2015-10-27 LAB — SURGICAL PATHOLOGY

## 2017-05-18 DEATH — deceased
